# Patient Record
Sex: Male | Born: 1955 | Race: Black or African American | Hispanic: No | Marital: Married | Smoking: Former smoker
Health system: Southern US, Community
[De-identification: ages and names within clinical notes are randomized; demographics above are authoritative.]

## PROBLEM LIST (undated history)

## (undated) ENCOUNTER — Emergency Department (HOSPITAL_COMMUNITY): Payer: Medicaid Other | Source: Home / Self Care

## (undated) DIAGNOSIS — K219 Gastro-esophageal reflux disease without esophagitis: Secondary | ICD-10-CM

## (undated) DIAGNOSIS — N400 Enlarged prostate without lower urinary tract symptoms: Secondary | ICD-10-CM

## (undated) DIAGNOSIS — N529 Male erectile dysfunction, unspecified: Secondary | ICD-10-CM

## (undated) DIAGNOSIS — I10 Essential (primary) hypertension: Secondary | ICD-10-CM

## (undated) HISTORY — DX: Benign prostatic hyperplasia without lower urinary tract symptoms: N40.0

## (undated) HISTORY — DX: Gastro-esophageal reflux disease without esophagitis: K21.9

## (undated) HISTORY — DX: Male erectile dysfunction, unspecified: N52.9

---

## 2015-01-11 ENCOUNTER — Emergency Department (INDEPENDENT_AMBULATORY_CARE_PROVIDER_SITE_OTHER)
Admission: EM | Admit: 2015-01-11 | Discharge: 2015-01-11 | Disposition: A | Discharge status: Nursing Home (Includes ICF) | Payer: Medicaid Other | Source: Home / Self Care | Attending: Family Medicine | Admitting: Family Medicine

## 2015-01-11 ENCOUNTER — Encounter (HOSPITAL_COMMUNITY): Payer: Self-pay | Admitting: Emergency Medicine

## 2015-01-11 ENCOUNTER — Emergency Department (HOSPITAL_COMMUNITY)
Admission: EM | Admit: 2015-01-11 | Discharge: 2015-01-11 | Disposition: A | Discharge status: Home / Self Care | Payer: Medicaid Other | Attending: Emergency Medicine | Admitting: Emergency Medicine

## 2015-01-11 DIAGNOSIS — R1084 Generalized abdominal pain: Secondary | ICD-10-CM | POA: Diagnosis not present

## 2015-01-11 DIAGNOSIS — R072 Precordial pain: Secondary | ICD-10-CM

## 2015-01-11 DIAGNOSIS — I10 Essential (primary) hypertension: Secondary | ICD-10-CM | POA: Diagnosis not present

## 2015-01-11 DIAGNOSIS — R0609 Other forms of dyspnea: Secondary | ICD-10-CM

## 2015-01-11 DIAGNOSIS — R0600 Dyspnea, unspecified: Secondary | ICD-10-CM

## 2015-01-11 DIAGNOSIS — I160 Hypertensive urgency: Secondary | ICD-10-CM | POA: Insufficient documentation

## 2015-01-11 DIAGNOSIS — R079 Chest pain, unspecified: Secondary | ICD-10-CM | POA: Diagnosis present

## 2015-01-11 LAB — I-STAT CHEM 8, ED
BUN: 24 mg/dL — ABNORMAL HIGH (ref 6–20)
CALCIUM ION: 1.25 mmol/L — AB (ref 1.12–1.23)
CREATININE: 1.5 mg/dL — AB (ref 0.61–1.24)
Chloride: 103 mmol/L (ref 101–111)
GLUCOSE: 104 mg/dL — AB (ref 65–99)
HCT: 45 % (ref 39.0–52.0)
HEMOGLOBIN: 15.3 g/dL (ref 13.0–17.0)
POTASSIUM: 4.7 mmol/L (ref 3.5–5.1)
Sodium: 140 mmol/L (ref 135–145)
TCO2: 27 mmol/L (ref 0–100)

## 2015-01-11 LAB — I-STAT TROPONIN, ED: TROPONIN I, POC: 0.03 ng/mL (ref 0.00–0.08)

## 2015-01-11 MED ORDER — LOSARTAN POTASSIUM 25 MG PO TABS: 25.0000 mg | ORAL_TABLET | Freq: Every day | ORAL | Status: DC

## 2015-01-11 MED ORDER — LISINOPRIL 20 MG PO TABS: 20.0000 mg | ORAL_TABLET | Freq: Every day | ORAL | Status: DC

## 2015-01-11 MED ORDER — NITROGLYCERIN 0.4 MG SL SUBL
0.4000 mg | SUBLINGUAL_TABLET | SUBLINGUAL | Status: DC | PRN
  Administered 2015-01-11: 0.4 mg via SUBLINGUAL

## 2015-01-11 MED ORDER — NITROGLYCERIN 0.4 MG SL SUBL
SUBLINGUAL_TABLET | SUBLINGUAL | Status: AC
  Filled 2015-01-11: qty 1

## 2015-01-11 NOTE — ED Notes (Signed)
Pt requested the Dr to change his prescription to lisinopril to something else because "my pressure is still high on lisinopril:" Dr Vanita Panda notified and agreed to change medicine.

## 2015-01-11 NOTE — Discharge Instructions (Signed)
Please be sure to follow-up in our affiliated primary care center. Walk in appointments are available starting at 9 AM weekdays.  Please be sure to discuss your medications with your new physician.  Return here for any concerning changes in your condition.

## 2015-01-11 NOTE — ED Notes (Signed)
C/i neck, headache pain.  [place] the country [place].  No chest pain today, no sob, no chest pain

## 2015-01-11 NOTE — ED Notes (Signed)
Patient is being evaluated by [facility]

## 2015-01-11 NOTE — ED Notes (Signed)
Pt states that he has had a cold for 20 days with an nonproductive cough. Pt states that he has had pain in his chest with his cough. Pt has also had a headache along with some pain in his sides bilaterally.

## 2015-01-11 NOTE — ED Provider Notes (Addendum)
CSN: [id number]     Arrival date & time 01/11/15  2102 History   First MD Initiated Contact with Patient 01/11/15 2108     Chief Complaint  Patient presents with  . Chest Pain  . Headache     (Consider location/radiation/quality/duration/timing/severity/associated sxs/prior Treatment) HPI Patient presents as a transfer from urgent care. Patient states that he always has chest pain, for 3 years. He also acknowledges a history of hypertension. Notably, the patient immigrated from [place] here 1 month ago. He states that he ran out of his medicines, has no medication. His chest pain is unchanged, with no new syncope, nausea, lightheadedness. No new abdominal pain, vomiting. No other new complaints. Today he went to urgent care due to the ongoing chest pain, and the lack of medication. He was referred here for further evaluation and management. The chest pain is not pleuritic, or exertional this is slightly different from the initial note. There is diffuse, anterior, sore.  History reviewed. No pertinent past medical history. History reviewed. No pertinent past surgical history. No family history on file. Social History  Substance Use Topics  . Smoking status: Unknown If Ever Smoked  . Smokeless tobacco: None  . Alcohol Use: None    Review of Systems  Constitutional:       Per HPI, otherwise negative  HENT:       Per HPI, otherwise negative  Respiratory:       Per HPI, otherwise negative  Cardiovascular:       Per HPI, otherwise negative  Gastrointestinal: Negative for vomiting.  Endocrine:       Negative aside from HPI  Genitourinary:       Neg aside from HPI   Musculoskeletal:       Per HPI, otherwise negative  Skin: Negative.   Neurological: Negative for syncope.      Allergies  Review of patient's allergies indicates no known allergies.  Home Medications   Prior to Admission medications   Not on File   BP 151/103 mmHg  Pulse 88  Temp(Src) 98.4 F  (36.9 C) (Oral)  Resp 14  SpO2 97% Physical Exam  Constitutional: He is oriented to person, place, and time. He appears well-developed. No distress.  HENT:  Head: Normocephalic and atraumatic.  Eyes: Conjunctivae and EOM are normal.  Cardiovascular: Normal rate and regular rhythm.   Pulmonary/Chest: Effort normal. No stridor. No respiratory distress.  Abdominal: He exhibits no distension.  Musculoskeletal: He exhibits no edema.  Neurological: He is alert and oriented to person, place, and time.  Skin: Skin is warm and dry.  Psychiatric: He has a normal mood and affect.  Nursing note and vitals reviewed.   ED Course  Procedures (including critical care time) Labs Review Labs Reviewed  I-STAT CHEM 8, ED - Abnormal; Notable for the following:    BUN 24 (*)    Creatinine, Ser 1.50 (*)    Glucose, Bld 104 (*)    Calcium, Ion 1.25 (*)    All other components within normal limits  I-STAT TROPOININ, ED       EKG Interpretation   Date/Time:  Thursday January 11 2015 21:06:46 EST Ventricular Rate:  84 PR Interval:  138 QRS Duration: 79 QT Interval:  367 QTC Calculation: 434 R Axis:   43 Text Interpretation:  Sinus rhythm Probable LVH with secondary repol abnrm  Anterior ST elevation, probably due to LVH Sinus rhythm Left ventricular  hypertrophy ST-t wave abnormality Abnormal ekg Confirmed by Carmin Muskrat  MD [phone]) on 01/11/2015 9:26:15 PM     I reviewed the urgent care documentation, including concern for worsening chest pain.  10:58 PM On repeat exam the patient has no ongoing complaints per We discussed all findings, the need to reinitiate medication, and the patient offers names consistent with ACE inhibitor, which will be restarted.  Patient states that he understands the importance of following up with primary care, at our clinic next week for repeat evaluation, and assurance of ongoing care.   Patient requested a change from lisinopril to alternative  antihypertensives, this was accommodated, lisinopril prescription destroyed. MDM  Patient presents with concern of hypertension, ongoing chest pain, has been present for years. Notably, the patient has no medication. Here no evidence for end organ effects, though there is mild abnormality of creatinine. In no distress, no ongoing pain, the patient was restarted on his ACE inhibitor, provided resources to follow-up with our primary care facility within the next 5 days, discharged in stable condition.  Carmin Muskrat, MD 01/11/15 [id number]  Carmin Muskrat, MD 01/11/15 [phone]

## 2015-01-11 NOTE — ED Notes (Signed)
Patient from [facility] with "heart pain" and headache.  Patient is hypertensive.  Patient has been in the [place] for about one month, he is from [place].  Patient had an abnormal EKG at [facility] per RN report.  Patient speaks limited [place] and speaks Arabic.  Patient is CAOx4 upon arrival.

## 2015-01-11 NOTE — ED Provider Notes (Signed)
CSN: [id number]     Arrival date & time 01/11/15  1808 History   First MD Initiated Contact with Patient 01/11/15 1945     Chief Complaint  Patient presents with  . Hypertension   (Consider location/radiation/quality/duration/timing/severity/associated sxs/prior Treatment) HPI Comments: 59 year old immigrant from [place], arriving 1 month ago and who speaks Arabic presents with complaint of need for medical management of hypertension. He states he also has tightness in the chest. He states that his arteries are tight in the chest as advised by his doctor in [place]. He is having more and more chest pain particularly with exertion. He is also having intermittent episodes of dyspnea and fatigue particularly with exertion. Sometimes he has a headache. The patient points to the mid and left chest as the location of the tightness. Through the interpreter it is my understanding he is currently having  chest tightness. Apparently he has recently received his Medicaid and presents to the urgent care for management of his acute and chronic health care problems. The tightness in the chest has been occurring for approximately 3 years however since arrival to the [place] and being without his medications is becoming more frequent and lasting longer.  Patient is a 59 y.o. male presenting with hypertension.  Hypertension Associated symptoms include chest pain, abdominal pain, headaches and shortness of breath.    History reviewed. No pertinent past medical history. History reviewed. No pertinent past surgical history. No family history on file. Social History  Substance Use Topics  . Smoking status: None  . Smokeless tobacco: None  . Alcohol Use: None    Review of Systems  Constitutional: Positive for activity change and fatigue. Negative for fever.  Respiratory: Positive for chest tightness and shortness of breath.   Cardiovascular: Positive for chest pain.  Gastrointestinal: Positive for  abdominal pain. Negative for vomiting.  Skin: Negative.   Neurological: Positive for headaches.    Allergies  Review of patient's allergies indicates no known allergies.  Home Medications   Prior to Admission medications   Not on File   Meds Ordered and Administered this Visit  Medications - No data to display  BP 152/96 mmHg  Pulse 78  Temp(Src) 98 F (36.7 C) (Oral)  Resp 16  SpO2 98% No data found.   Physical Exam  Constitutional: He appears well-developed and well-nourished. No distress.  Neck: Normal range of motion. Neck supple.  Cardiovascular: Normal rate, regular rhythm and normal heart sounds.   Pulmonary/Chest: Effort normal and breath sounds normal. No respiratory distress. He has no wheezes. He has no rales.  Neurological: He is alert. He exhibits normal muscle tone.  Skin: Skin is warm and dry.  Nursing note and vitals reviewed.   ED Course  Procedures (including critical care time)  Labs Review Labs Reviewed - No data to display  Imaging Review No results found. ED ECG REPORT   Date: 01/11/2015  Rate: 71  Rhythm: normal sinus rhythm  QRS Axis: normal  Intervals: normal  ST/T Wave abnormalities: ST segment approximately one half to 1 mm depression in lead 1, 2. T wave inversions in aVL, V5 and - 6. ST elevation in V1, V2, V3(likely early repolarization), increased amplitude of R waves in the precordial/ lateral leads suggesting left ventricular hypertrophy.  Conduction Disutrbances:none  Narrative Interpretation:   Old EKG Reviewed: none available  I have personally reviewed the EKG tracing and agree with the computerized printout as noted.   Visual Acuity Review  Right Eye Distance:  Left Eye Distance:   Bilateral Distance:    Right Eye Near:   Left Eye Near:    Bilateral Near:         MDM   1. Precordial pain   2. DOE (dyspnea on exertion)   3. Essential hypertension   4. Generalized abdominal pain    Refugee from [place],  been in country for one month and presented to the urgent care for management of acute and chronic disease states including chest pain/tightness that is been occurring more frequently in the past month, HTN, DOE, fatigue. He has been off his medications. My understanding on arrival he was having chest tightness. At the time of transfer is pain free or "not very severe now". He does not offer paper work or bottles for med identification. IV normal saline TKO Oxygen 2L/[place]. Aspirin 5 gr NTG 0.4 mg sl adm 1832h.  BP 190/118. [facility] here 1835h.    Janne Napoleon, NP 01/11/15 2041

## 2015-01-11 NOTE — ED Notes (Signed)
At the time of providing ntg, patient did not have pain, but hypertensive-David Mabe, NP aware and ordered ntg to be given

## 2015-01-11 NOTE — ED Notes (Signed)
[facility]-no truck

## 2015-01-11 NOTE — ED Notes (Signed)
Pt verbalized understanding of d/c instructions and has no further questions. Pt is to follow up with [facility]. Pt in NAD upon d/c.

## 2015-01-11 NOTE — ED Notes (Signed)
Notified [facility] 

## 2015-01-31 ENCOUNTER — Ambulatory Visit: Payer: Self-pay

## 2015-02-13 ENCOUNTER — Emergency Department (HOSPITAL_COMMUNITY)
Admission: EM | Admit: 2015-02-13 | Discharge: 2015-02-13 | Disposition: A | Discharge status: Home / Self Care | Payer: Medicaid Other | Source: Home / Self Care | Attending: Emergency Medicine | Admitting: Emergency Medicine

## 2015-02-13 ENCOUNTER — Encounter (HOSPITAL_COMMUNITY): Payer: Self-pay | Admitting: Emergency Medicine

## 2015-02-13 DIAGNOSIS — I159 Secondary hypertension, unspecified: Secondary | ICD-10-CM

## 2015-02-13 DIAGNOSIS — I10 Essential (primary) hypertension: Secondary | ICD-10-CM

## 2015-02-13 MED ORDER — AMLODIPINE BESYLATE 5 MG PO TABS: 5.0000 mg | ORAL_TABLET | Freq: Every day | ORAL | Status: DC

## 2015-02-13 NOTE — Congregational Nurse Program (Signed)
Congregational Nurse Program Note  Date of Encounter: 02/13/2015  Past Medical History: No past medical history on file.  Encounter Details:     CNP Questionnaire - 02/13/15 1345    Patient Demographics   Is this a new or existing patient? New   Patient is considered a/an Refugee   Patient Assistance   Patient's financial/insurance status Medicaid   Patient referred to apply for the following financial assistance Not Applicable   Food insecurities addressed Not Applicable   Transportation assistance Yes  Taxi voucher   Assistance securing medications No   Educational health offerings Hypertension   Encounter Details   Primary purpose of visit Acute Illness/Condition Visit;Navigating the Healthcare System   Was an Emergency Department visit averted? No   Does patient have a medical provider? No   Patient referred to Urgent Care   Was a mental health screening completed? (GAINS tool) No   Does patient have dental issues? No   Since previous encounter, have you referred patient for abnormal blood pressure that resulted in a new diagnosis or medication change? No   Since previous encounter, have you referred patient for abnormal blood glucose that resulted in a new diagnosis or medication change? No   For Abstraction Use Only   Does patient have insurance? No     Office visit, 10am at [facility] requesting blood pressure check. No obvious distress noted. Blood pressure 186/108. Pulse 65; 165/107. Pulse 76/ ?irregular.  Expressed concern that medication, Losartan 25 mg, ordered by [facility] unavailable at [facility]. Prescription confirmed for 30 days only. Follow-up appointment with [facility] on 11-16 apparently canceled by client per staff. Client denied knowledge of cancellation. Advised to call back 03-06-15 for scheduling. Placed on call  list for cancellations. Describes multiple areas of body with pain including history of chest and arm pain. No  other medical documentation available.  Plan: Refer to [facility] for evaluation and medication refill todayl. Place on [facility] waiting list for appointment if cancellations. Return for B/P follow-up at [place]. Information provided regarding use of pharmacy. Marland Kitchen

## 2015-02-13 NOTE — ED Notes (Signed)
Offered pt interpreter but he declined; states he speaks [place] Able to understand pt somewhat but provider may want to encourage phone interpreter Pt referred by [facility] Congregational Nurse for HTN BP was this am taken by Congregational Nurse was 186/108 Pt today c/o mild HA (3/10) and feeling weak Denies CP, blurry vision Seen here on 11/10 for similar sx Taking Losartan 25 mg A&O x4... No acute distress.

## 2015-02-13 NOTE — Discharge Instructions (Signed)
There is a risk of taking medications prescribed by a health care provider without the time or ability for obtaining a complete history, labwork, proper physical exam, etc. This cannot be completed adequately at an urgent care. There can be multiple problems, some serious,  associated with medications and undetermined conditions of your health status. This action is performed as a last resort effort in order to supply you with medication. By receiving these prescriptions you are ackowleging and accepting these risks and will not hold the prescriber or any agent of [facility] as responsible for any adverse outcomes.     Hypertension Hypertension, commonly called high blood pressure, is when the force of blood pumping through your arteries is too strong. Your arteries are the blood vessels that carry blood from your heart throughout your body. A blood pressure reading consists of a higher number over a lower number, such as 110/72. The higher number (systolic) is the pressure inside your arteries when your heart pumps. The lower number (diastolic) is the pressure inside your arteries when your heart relaxes. Ideally you want your blood pressure below 120/80. Hypertension forces your heart to work harder to pump blood. Your arteries may become narrow or stiff. Having untreated or uncontrolled hypertension can cause heart attack, stroke, kidney disease, and other problems. RISK FACTORS Some risk factors for high blood pressure are controllable. Others are not.  Risk factors you cannot control include:   Race. You may be at higher risk if you are African American.  Age. Risk increases with age.  Gender. Men are at higher risk than women before age 93 years. After age 12, women are at higher risk than men. Risk factors you can control include:  Not getting enough exercise or physical activity.  Being overweight.  Getting too much fat, sugar, calories, or salt in your  diet.  Drinking too much alcohol. SIGNS AND SYMPTOMS Hypertension does not usually cause signs or symptoms. Extremely high blood pressure (hypertensive crisis) may cause headache, anxiety, shortness of breath, and nosebleed. DIAGNOSIS To check if you have hypertension, your health care provider will measure your blood pressure while you are seated, with your arm held at the level of your heart. It should be measured at least twice using the same arm. Certain conditions can cause a difference in blood pressure between your right and left arms. A blood pressure reading that is higher than normal on one occasion does not mean that you need treatment. If it is not clear whether you have high blood pressure, you may be asked to return on a different day to have your blood pressure checked again. Or, you may be asked to monitor your blood pressure at home for 1 or more weeks. TREATMENT Treating high blood pressure includes making lifestyle changes and possibly taking medicine. Living a healthy lifestyle can help lower high blood pressure. You may need to change some of your habits. Lifestyle changes may include:  Following the DASH diet. This diet is high in fruits, vegetables, and whole grains. It is low in salt, red meat, and added sugars.  Keep your sodium intake below 2,300 mg per day.  Getting at least 30-45 minutes of aerobic exercise at least 4 times per week.  Losing weight if necessary.  Not smoking.  Limiting alcoholic beverages.  Learning ways to reduce stress. Your health care provider may prescribe medicine if lifestyle changes are not enough to get your blood pressure under control, and if  one of the following is true:  You are 55-65 years of age and your systolic blood pressure is above 140.  You are 39 years of age or older, and your systolic blood pressure is above 150.  Your diastolic blood pressure is above 90.  You have diabetes, and your systolic blood pressure is over  [id number] or your diastolic blood pressure is over 90.  You have kidney disease and your blood pressure is above 140/90.  You have heart disease and your blood pressure is above 140/90. Your personal target blood pressure may vary depending on your medical conditions, your age, and other factors. HOME CARE INSTRUCTIONS  Have your blood pressure rechecked as directed by your health care provider.   Take medicines only as directed by your health care provider. Follow the directions carefully. Blood pressure medicines must be taken as prescribed. The medicine does not work as well when you skip doses. Skipping doses also puts you at risk for problems.  Do not smoke.   Monitor your blood pressure at home as directed by your health care provider. SEEK MEDICAL CARE IF:   You think you are having a reaction to medicines taken.  You have recurrent headaches or feel dizzy.  You have swelling in your ankles.  You have trouble with your vision. SEEK IMMEDIATE MEDICAL CARE IF:  You develop a severe headache or confusion.  You have unusual weakness, numbness, or feel faint.  You have severe chest or abdominal pain.  You vomit repeatedly.  You have trouble breathing. MAKE SURE YOU:   Understand these instructions.  Will watch your condition.  Will get help right away if you are not doing well or get worse.   This information is not intended to replace advice given to you by your health care provider. Make sure you discuss any questions you have with your health care provider.   Document Released: 02/17/2005 Document Revised: 07/04/2014 Document Reviewed: 12/10/2012 Elsevier Interactive Patient Education [employer].

## 2015-02-13 NOTE — ED Provider Notes (Signed)
CSN: [id number]     Arrival date & time 02/13/15  1322 History   First MD Initiated Contact with Patient 02/13/15 1426     Chief Complaint  Patient presents with  . Hypertension   (Consider location/radiation/quality/duration/timing/severity/associated sxs/prior Treatment) HPI Comments: 59 year old male is an African immigrant that in or the country approximately 2 months ago. One month ago he was seen in this urgent care for management of hypertension. He was found to have chest pain off and on for a "long time". He had also been having chronic headaches. He had been out of his medications and we do not know what that medication was. Due to his new arrival, unknown past medical history and type of management/medication he was sent to the emergency department. He was found not to have an acute cardiology problem. He was discharged on losartan 25 mg. The congregation nurse repeated his blood pressure today and it was 187/110 and again sent to the urgent care. The patient states that the losartan makes him feel funny in the head and requests not to have that medication refilled. He has Medicaid and apparently he is on a waiting list for the family practice. He has similar chest pain that he has been having for over 3 years as well as anterior chest wall tenderness that reproduces the same pain. He is in no acute distress.   History reviewed. No pertinent past medical history. History reviewed. No pertinent past surgical history. No family history on file. Social History  Substance Use Topics  . Smoking status: Never Smoker   . Smokeless tobacco: None  . Alcohol Use: No    Review of Systems  Constitutional: Negative for fever, activity change and fatigue.  Respiratory: Negative for cough and shortness of breath.   Cardiovascular: Positive for chest pain. Negative for leg swelling.  Gastrointestinal: Negative.   Skin: Negative.   Neurological: Positive for headaches. Negative for facial  asymmetry and speech difficulty.    Allergies  Review of patient's allergies indicates no known allergies.  Home Medications   Prior to Admission medications   Medication Sig Start Date End Date Taking? Authorizing Provider  amLODipine (NORVASC) 5 MG tablet Take 1 tablet (5 mg total) by mouth daily. 02/13/15   Janne Napoleon, NP   Meds Ordered and Administered this Visit  Medications - No data to display  BP 177/96 mmHg  Pulse 69  Temp(Src) 98.2 F (36.8 C) (Oral)  Resp 20  SpO2 99% No data found.   Physical Exam  Constitutional: He appears well-developed and well-nourished. No distress.  Eyes: EOM are normal.  Neck: Normal range of motion. Neck supple.  Cardiovascular: Normal rate, regular rhythm and normal heart sounds.   Pulmonary/Chest: Effort normal and breath sounds normal. No respiratory distress. He has no wheezes. He exhibits tenderness.  Musculoskeletal: He exhibits no edema.  Lymphadenopathy:    He has no cervical adenopathy.  Neurological: He is alert. No cranial nerve deficit. He exhibits normal muscle tone.  Skin: Skin is warm and dry.  Psychiatric: He has a normal mood and affect. His behavior is normal.  Nursing note and vitals reviewed.   ED Course  Procedures (including critical care time)  Labs Review Labs Reviewed - No data to display  Imaging Review No results found.   Visual Acuity Review  Right Eye Distance:   Left Eye Distance:   Bilateral Distance:    Right Eye Near:   Left Eye Near:    Bilateral Near:  MDM   1. Essential hypertension    Stop losartan and start norvasc 5 mg po q d.  Must f/u with PCP ASAP    Janne Napoleon, NP 02/13/15 1457

## 2015-02-13 NOTE — Congregational Nurse Program (Signed)
Congregational Nurse Program Note  Date of Encounter: 02/13/2015  Past Medical History: No past medical history on file.  Encounter Details:     CNP Questionnaire - 02/13/15 1345    Patient Demographics   Is this a new or existing patient? New   Patient is considered a/an Refugee   Patient Assistance   Patient's financial/insurance status Medicaid   Patient referred to apply for the following financial assistance Not Applicable   Food insecurities addressed Not Applicable   Transportation assistance Yes  Taxi voucher   Assistance securing medications No   Educational health offerings Hypertension   Encounter Details   Primary purpose of visit Acute Illness/Condition Visit;Navigating the Healthcare System   Was an Emergency Department visit averted? No   Does patient have a medical provider? No   Patient referred to Urgent Care   Was a mental health screening completed? (GAINS tool) No   Does patient have dental issues? No   Since previous encounter, have you referred patient for abnormal blood pressure that resulted in a new diagnosis or medication change? No   Since previous encounter, have you referred patient for abnormal blood glucose that resulted in a new diagnosis or medication change? No   For Abstraction Use Only   Does patient have insurance? No

## 2015-03-06 DIAGNOSIS — I159 Secondary hypertension, unspecified: Secondary | ICD-10-CM

## 2015-03-06 NOTE — Congregational Nurse Program (Signed)
Congregational Nurse Program Note  Date of Encounter: 03/06/2015  Past Medical History: No past medical history on file.  Encounter Details:     CNP Questionnaire - 03/06/15 1307    Patient Demographics   Is this a new or existing patient? Existing   Patient is considered a/an Refugee   Race African   Patient Assistance   Location of Patient Assistance Not Applicable   Patient's financial/insurance status Medicaid   Uninsured Patient No   Patient referred to apply for the following financial assistance Not Applicable   Food insecurities addressed Not Applicable   Transportation assistance No   Assistance securing medications No   Educational health offerings Hypertension   Encounter Details   Primary purpose of visit Acute Illness/Condition Visit;Navigating the Healthcare System   Was an Emergency Department visit averted? Not Applicable   Does patient have a medical provider? Yes   Patient referred to Follow up with established PCP   Was a mental health screening completed? (GAINS tool) No   Does patient have dental issues? No   Since previous encounter, have you referred patient for abnormal blood pressure that resulted in a new diagnosis or medication change? Yes   Since previous encounter, have you referred patient for abnormal blood glucose that resulted in a new diagnosis or medication change? No   For Abstraction Use Only   Does patient have insurance? No    Office visit to follow-up elevated blood pressure and referral to Urgent Care on 02-13-15. Treated for hypertension and released home with prescription medications. Blood pressure 164/96; pulse 73; respiration 22. Able to relate information in limited [place]; primary language is Arabic. Admits to forgetting to take dose of Lisinopril this am.  Describes symptoms of dizziness sitting or lying. Alert and in no visible distress. Plan:Take medications as instructed immediately today.  Return to office 03/06/15 for B/P  check and review of medications. Info on hypertension provided and nutritional info on limiting salt intake. Limit extreme activity until PCP/[facility] visit. Seek emergency care if symptoms worse or change, i.e chest pain, severe headache, blurry vision, nausea and vomiting.

## 2015-03-07 DIAGNOSIS — I159 Secondary hypertension, unspecified: Secondary | ICD-10-CM

## 2015-03-07 NOTE — Congregational Nurse Program (Signed)
Congregational Nurse Program Note  Date of Encounter: 03/07/2015  Past Medical History: No past medical history on file.  Encounter Details:     CNP Questionnaire - 03/07/15 1517    Patient Demographics   Is this a new or existing patient? Existing   Patient is considered a/an Refugee   Race African   Patient Assistance   Location of Patient Assistance Not Applicable   Patient's financial/insurance status [facility];Medicaid   Uninsured Patient No   Patient referred to apply for the following financial assistance Medicaid   Food insecurities addressed Not Applicable   Assistance securing medications No   Educational health offerings Hypertension   Encounter Details   Primary purpose of visit Acute Illness/Condition Visit   Was an Emergency Department visit averted? Not Applicable   Does patient have a medical provider? Yes   Patient referred to Establish PCP   Was a mental health screening completed? (GAINS tool) No   Does patient have dental issues? No   Since previous encounter, have you referred patient for abnormal blood pressure that resulted in a new diagnosis or medication change? No   Since previous encounter, have you referred patient for abnormal blood glucose that resulted in a new diagnosis or medication change? No   For Abstraction Use Only   Does patient have insurance? No     Office visit to follow-u[p elevated B/P and review medications. Blood pressure 156/97 @ 10am. Repeated 156/91. Pulse 78. Continues to experience intermittent dizziness upon frequent movements and bending forward. Medications taken as directed: Lisinopril 12.5 daily; Metoprolol succinate 25 mg daily; Aspirin 81 mg delayed release daily. Omeprazole 20mg  daily. Amlodipine and Losartan discontinued on 02/28/15. Follow-up appointment scheduled 03-27-15; 9am; [facility];[address]. Return to see  Nurse 01-10 for blood pressure check.

## 2015-03-07 NOTE — Patient Instructions (Signed)
Follow up visit to [facility] 1 week;check blood pressure.

## 2015-03-15 ENCOUNTER — Encounter: Payer: Self-pay | Admitting: Family Medicine

## 2015-03-15 ENCOUNTER — Ambulatory Visit (INDEPENDENT_AMBULATORY_CARE_PROVIDER_SITE_OTHER): Payer: Medicaid Other | Admitting: Family Medicine

## 2015-03-15 VITALS — BP 168/98 | HR 78 | Temp 97.9°F

## 2015-03-15 DIAGNOSIS — I10 Essential (primary) hypertension: Secondary | ICD-10-CM

## 2015-03-15 DIAGNOSIS — N529 Male erectile dysfunction, unspecified: Secondary | ICD-10-CM | POA: Diagnosis not present

## 2015-03-15 DIAGNOSIS — Z7189 Other specified counseling: Secondary | ICD-10-CM | POA: Diagnosis not present

## 2015-03-15 DIAGNOSIS — Z7689 Persons encountering health services in other specified circumstances: Secondary | ICD-10-CM

## 2015-03-15 DIAGNOSIS — Z1211 Encounter for screening for malignant neoplasm of colon: Secondary | ICD-10-CM | POA: Diagnosis not present

## 2015-03-15 LAB — COMPLETE METABOLIC PANEL WITH GFR
ALT: 14 U/L (ref 9–46)
AST: 18 U/L (ref 10–35)
Albumin: 4.3 g/dL (ref 3.6–5.1)
Alkaline Phosphatase: 77 U/L (ref 40–115)
BILIRUBIN TOTAL: 0.4 mg/dL (ref 0.2–1.2)
BUN: 28 mg/dL — AB (ref 7–25)
CHLORIDE: 102 mmol/L (ref 98–110)
CO2: 24 mmol/L (ref 20–31)
CREATININE: 1.48 mg/dL — AB (ref 0.70–1.33)
Calcium: 9.6 mg/dL (ref 8.6–10.3)
GFR, EST AFRICAN AMERICAN: 60 mL/min (ref >=60)
GFR, Est Non African American: 52 mL/min — ABNORMAL LOW (ref >=60)
GLUCOSE: 81 mg/dL (ref 65–99)
Potassium: 4.7 mmol/L (ref 3.5–5.3)
Sodium: 142 mmol/L (ref 135–146)
TOTAL PROTEIN: 7.4 g/dL (ref 6.1–8.1)

## 2015-03-15 LAB — CBC WITH DIFFERENTIAL/PLATELET
Basophils Absolute: 0.1 10*3/uL (ref 0.0–0.1)
Basophils Relative: 1 % (ref 0–1)
Eosinophils Absolute: 0.4 10*3/uL (ref 0.0–0.7)
Eosinophils Relative: 4 % (ref 0–5)
HCT: 43.8 % (ref 39.0–52.0)
Hemoglobin: 14.5 g/dL (ref 13.0–17.0)
Lymphocytes Relative: 19 % (ref 12–46)
Lymphs Abs: 1.8 10*3/uL (ref 0.7–4.0)
MCH: 28.8 pg (ref 26.0–34.0)
MCHC: 33.1 g/dL (ref 30.0–36.0)
MCV: 87.1 fL (ref 78.0–100.0)
MPV: 10.6 fL (ref 8.6–12.4)
Monocytes Absolute: 0.8 10*3/uL (ref 0.1–1.0)
Monocytes Relative: 8 % (ref 3–12)
Neutro Abs: 6.4 10*3/uL (ref 1.7–7.7)
Neutrophils Relative %: 68 % (ref 43–77)
Platelets: 209 10*3/uL (ref 150–400)
RBC: 5.03 MIL/uL (ref 4.22–5.81)
RDW: 14.2 % (ref 11.5–15.5)
WBC: 9.4 10*3/uL (ref 4.0–10.5)

## 2015-03-15 LAB — LIPID PANEL
Cholesterol: 174 mg/dL (ref 125–200)
HDL: 44 mg/dL (ref >=40)
LDL CALC: 101 mg/dL (ref <130)
TRIGLYCERIDES: 145 mg/dL (ref <150)
Total CHOL/HDL Ratio: 4 Ratio (ref <=5.0)
VLDL: 29 mg/dL (ref <30)

## 2015-03-15 MED ORDER — AMLODIPINE BESYLATE 10 MG PO TABS: 10.0000 mg | ORAL_TABLET | Freq: Every day | ORAL | Status: DC

## 2015-03-15 NOTE — Progress Notes (Addendum)
Patient ID: Thomas Bailey, male   DOB: 1955/04/11, 60 y.o.   MRN: [id number]   Thomas Bailey, is a 60 y.o. male  [id number]  [id number]  DOB - 05-Jan-1956  CC:  Chief Complaint  Patient presents with  . new patient, establish care and has problems    Pietro Cassis, interpreter with patient, high blood pressure, has pain in chest, pain in side of abd, feels full after eating so he feels he doesnt eat enough, also thinks he has ED due to B/P issues is not sexually active       HPI: Thomas Bailey is a 60 y.o. male here to establish care. He has a history of hypertension, non-specific chest pain. He was recently seen at Urgent care and losartan and lisinopril discontinued and amlodipine 5 mg and lisinopril hctz started. He also c/o long standing abdominal pressure occasionally. He also c/o ED and request referral to urology.  Infomration is obtained with the help of an interpreter.He reports Tdap and influenza being up to date.  No Known Allergies No past medical history on file. Current Outpatient Prescriptions on File Prior to Visit  Medication Sig Dispense Refill  . [DISCONTINUED] lisinopril (PRINIVIL,ZESTRIL) 20 MG tablet Take 1 tablet (20 mg total) by mouth daily. 30 tablet 2  . [DISCONTINUED] losartan (COZAAR) 25 MG tablet Take 1 tablet (25 mg total) by mouth daily. 30 tablet 0   No current facility-administered medications on file prior to visit.   No family history on file. Social History   Social History  . Marital Status: Married    Spouse Name: N/A  . Number of Children: N/A  . Years of Education: N/A   Occupational History  . Not on file.   Social History Main Topics  . Smoking status: Never Smoker   . Smokeless tobacco: Not on file  . Alcohol Use: No  . Drug Use: No  . Sexual Activity: Not on file   Other Topics Concern  . Not on file   Social History Narrative    Review of Systems: Constitutional: Negative for fever, chills, appetite  change, weight loss,  Fatigue. Skin: Negative for rashes or lesions of concern. HENT: Negative for ear pain, ear discharge.nose bleeds Eyes: Negative for pain, discharge, redness, itching and visual disturbance. Neck: Negative for pain, stiffness Respiratory: Negative for cough. Positive for perceived  shortness of breath,   Cardiovascular: Positive for non-specific  chest pain which has been evaluated at Urgent Care and in ED with no cardiac issues identified. He also reports palpitations and leg swelling. Gastrointestinal: Positive  for abdominal pain, Heartburn. nausea, vomiting, diarrhea, constipations Genitourinary: Negative for dysuria, urgency, frequency, hematuria,  Musculoskeletal: Positive  for back pain, joint pain in hips and ankles joint  swelling, and gait problem.Negative for weakness. Neurological: Negative for tremors, seizures, syncope,   light-headedness, numbness and headaches. Positive for positional dizziness. Hematological: Negative for easy bruising or bleeding Psychiatric/Behavioral: Negative for depression, anxiety, decreased concentration, confusion   Objective:   Filed Vitals:   03/15/15 0948 03/15/15 0949  BP: 178/112 168/98  Pulse: 78   Temp: 97.9 F (36.6 C)     Physical Exam: Constitutional: Patient appears well-developed and well-nourished. No distress. HENT: Normocephalic, atraumatic, External right and left ear normal. Oropharynx is clear and moist.  Eyes: Conjunctivae and EOM are normal. PERRLA, no scleral icterus. Neck: Normal ROM. Neck supple. No lymphadenopathy, No thyromegaly. CVS: RRR, S1/S2 +, no murmurs, no gallops, no rubs Pulmonary: Effort and breath sounds  normal, no stridor, rhonchi, wheezes, rales.  Abdominal: Soft. Normoactive BS,, no distension, tenderness, rebound or guarding.  Musculoskeletal: Normal range of motion. No edema and no tenderness.  Neuro: Alert.Normal muscle tone coordination. Non-focal Skin: Skin is warm and dry. No  rash noted. Not diaphoretic. No erythema. No pallor. Psychiatric: Normal mood and affect. Behavior, judgment, thought content normal.  Lab Results  Component Value Date   HGB 15.3 01/11/2015   HCT 45.0 01/11/2015   Lab Results  Component Value Date   CREATININE 1.50* 01/11/2015   BUN 24* 01/11/2015   NA 140 01/11/2015   K 4.7 01/11/2015   CL 103 01/11/2015    No results found for: HGBA1C Lipid Panel  No results found for: CHOL, TRIG, HDL, CHOLHDL, VLDL, LDLCALC     Assessment and plan:   1. Essential hypertension  - COMPLETE METABOLIC PANEL WITH GFR - CBC w/Diff - TSH - Lipid panel - amLODipine (NORVASC) 10 MG tablet; Take 1 tablet (10 mg total) by mouth daily.  Dispense: 90 tablet; Refill: 3 -Return in one week for BP check with nurse.  2. Erectile dysfunction, unspecified erectile dysfunction type  - Ambulatory referral to Urology  3. Encounter to establish care -I have reviewed information provided by the patient and available information in his chart.    Return in about 1 week (around 03/22/2015) for HTN.  The patient was given clear instructions to go to ER or return to medical center if symptoms don't improve, worsen or new problems develop. The patient verbalized understanding.    Micheline Chapman FNP  03/15/2015, 1:03 PM

## 2015-03-15 NOTE — Patient Instructions (Signed)
I am increasing amlodipine from 5 mg to 10 mg daily. Continue other medications at current dosage. Come back in one week for nurse visit for BP check. Watch salt in diet Sit on side of bed when you get up until dizziness passes. Will put in referral to urologist for ED.

## 2015-03-16 LAB — TSH: TSH: 0.946 u[IU]/mL (ref 0.350–4.500)

## 2015-03-20 DIAGNOSIS — I159 Secondary hypertension, unspecified: Secondary | ICD-10-CM

## 2015-03-20 NOTE — Congregational Nurse Program (Signed)
Congregational Nurse Program Note  Date of Encounter: 03/20/2015  Past Medical History: No past medical history on file.  Encounter Details:     CNP Questionnaire - 03/20/15 1348    Patient Demographics   Is this a new or existing patient? Existing   Patient is considered a/an Refugee   Race African   Patient Assistance   Location of Patient Assistance Not Applicable   Patient's financial/insurance status Medicaid   Uninsured Patient No   Patient referred to apply for the following financial assistance Not Applicable   Food insecurities addressed Not Applicable   Transportation assistance No   Assistance securing medications No   Educational health offerings Not Applicable   Encounter Details   Primary purpose of visit Post PCP Visit   Was an Emergency Department visit averted? Not Applicable   Does patient have a medical provider? Yes   Patient referred to Follow up with established PCP   Was a mental health screening completed? (GAINS tool) No   Does patient have dental issues? No   Since previous encounter, have you referred patient for abnormal blood pressure that resulted in a new diagnosis or medication change? No   Since previous encounter, have you referred patient for abnormal blood glucose that resulted in a new diagnosis or medication change? No   For Abstraction Use Only   Does patient have insurance? No         Amb Nursing Assessment - 03/15/15 0955    Language Assistant   Interpreter Name Pietro Cassis     Office visit to recheck B/P. Continued hypertensive, despite changes in medication. Client confirmed medications taken as instructed. Recent new patient visit establish PCP and return visit to provider at [facility] 03-22-15. Complained of bilateral low back pain today. Denies changes in urinary pattern or painful, bloody urination. Return for blood pressure recheck on 03-31-15 per request of patient.   Plan: Review of appointment follow-up with PCP; bus  transportation information confirmed. Recheck blood pressure 03-21-15. Stress  importance of meds taken as ordered and low salt diet.

## 2015-03-21 DIAGNOSIS — I159 Secondary hypertension, unspecified: Secondary | ICD-10-CM

## 2015-03-21 NOTE — Congregational Nurse Program (Unsigned)
Congregational Nurse Program Note  Date of Encounter: 03/21/2015  Past Medical History: No past medical history on file.  Encounter Details:     CNP Questionnaire - 03/21/15 1310    Patient Demographics   Is this a new or existing patient? Existing   Patient is considered a/an Refugee   Race African   Patient Assistance   Patient's financial/insurance status Medicaid   Uninsured Patient No   Patient referred to apply for the following financial assistance Not Applicable   Food insecurities addressed Not Applicable   Transportation assistance Yes  Taxi voucher for 03/22/15 appt.   Educational health offerings Hypertension   Encounter Details   Primary purpose of visit Education/Health Concerns   Was an Emergency Department visit averted? Not Applicable   Does patient have a medical provider? Yes   Patient referred to Follow up with established PCP   Was a mental health screening completed? (GAINS tool) No   Does patient have dental issues? No   Since previous encounter, have you referred patient for abnormal blood pressure that resulted in a new diagnosis or medication change? No   Since previous encounter, have you referred patient for abnormal blood glucose that resulted in a new diagnosis or medication change? No   For Abstraction Use Only   Does patient have insurance? No         Amb Nursing Assessment - 03/15/15 0955    Language Assistant   Interpreter Name Pietro Cassis     Office visit at [facility] to follow-up elevated blood pressure and expressed concerns about cause of problem. Confirmed PCP appointment in [facility] on 03-22-15 at 9 am with Sharon Seller, NP.

## 2015-03-22 ENCOUNTER — Other Ambulatory Visit (INDEPENDENT_AMBULATORY_CARE_PROVIDER_SITE_OTHER): Payer: Medicaid Other | Admitting: Family Medicine

## 2015-03-22 ENCOUNTER — Encounter: Payer: Self-pay | Admitting: Family Medicine

## 2015-03-22 ENCOUNTER — Other Ambulatory Visit (HOSPITAL_COMMUNITY): Payer: Self-pay | Admitting: Family Medicine

## 2015-03-22 DIAGNOSIS — R079 Chest pain, unspecified: Secondary | ICD-10-CM

## 2015-03-22 MED ORDER — METOPROLOL SUCCINATE ER 50 MG PO TB24: 50.0000 mg | ORAL_TABLET | Freq: Every day | ORAL | Status: DC

## 2015-03-22 NOTE — Patient Instructions (Addendum)
Patient doesn't think prilosec is helping, advised Sharon Seller, NP, advised patient per her order to increase Prilosec to 2x day for 1 week, after 1 week take one tablet daily. New rx sent to pharmacy for blood pressure. Recheck here in 2 weeks and to call sooner if symptoms worsen or persist.  1. There is nothing to suggest that your chest pain is related to your heart. 2. Your description and the fact that it hurts to press indicates that it is muscle pain. Since it only last a minute or two and only happens 2-3 times a week, I would not treat with pain medication. 3. I suggest you eat several small meals a day rather than 1-2 large meals. Pay attention to foods that cause stomach discomfort and avoid them. 4. I have increased your metoprolol to 50 mg twice a day. 5. Come back for a BP check with the nurse in 2 weeks.

## 2015-03-22 NOTE — Progress Notes (Signed)
Patient ID: Thomas Bailey, male   DOB: [birth year], 58 y.o.   MRN: [id number]   Thomas Bailey, is a 60 y.o. male  [id number]  [id number]  DOB - 1955/03/15  CC: No chief complaint on file.      HPI: Thomas Bailey is a 60 y.o. male here for follow-up on hypertension. He was seen a week ago and part of his complaint was long standing intermittent chest pain. This was evaluated in ED as he was having pain at that time. His ECG Showed LVH but no ischemic changes.  Today he once again brings up the chest pain. He mentioned that he was diagnosed with a narrow artery in [place] but does not have a copy of his records.  He reports the pain is in the left anterio/lateral chest and sometimes radiates into the left arm. He reports it is sharp, last for a minute or two and happens 2-3 times a week.  He is unclear about any precipitating factors, except he has notices it tends to happen when he is in bed on his left side and resolves when he rolls over.  He denies anyother symptoms such as shortness of breath, dizziness, perspiring. He reports the pain is 5/10 at this time.He also complains of abdominal pain if he is hungry or if he eats too much.  No Known Allergies No past medical history on file. Current Outpatient Prescriptions on File Prior to Visit  Medication Sig Dispense Refill  . amLODipine (NORVASC) 10 MG tablet Take 1 tablet (10 mg total) by mouth daily. 90 tablet 3  . ASPIRIN LOW DOSE 81 MG EC tablet TK 1 T PO QD  11  . lisinopril-hydrochlorothiazide (PRINZIDE,ZESTORETIC) 20-12.5 MG tablet TK 1 T PO QD  5  . omeprazole (PRILOSEC) 20 MG capsule TK 1 C PO QD BEFORE A MEAL  5  . [DISCONTINUED] lisinopril (PRINIVIL,ZESTRIL) 20 MG tablet Take 1 tablet (20 mg total) by mouth daily. 30 tablet 2  . [DISCONTINUED] losartan (COZAAR) 25 MG tablet Take 1 tablet (25 mg total) by mouth daily. 30 tablet 0   No current facility-administered medications on file prior to visit.   No family  history on file. Social History   Social History  . Marital Status: Married    Spouse Name: N/A  . Number of Children: N/A  . Years of Education: N/A   Occupational History  . Not on file.   Social History Main Topics  . Smoking status: Never Smoker   . Smokeless tobacco: Not on file  . Alcohol Use: No  . Drug Use: No  . Sexual Activity: Not on file   Other Topics Concern  . Not on file   Social History Narrative    Review of Systems: Constitutional: Negative for fever, chills, appetite change, weight loss,  Fatigue. Skin: Negative for rashes or lesions of concern. HENT: Negative for ear pain, ear discharge.nose bleeds Eyes: Negative for pain, discharge, redness, itching and visual disturbance. Neck: Negative for pain, stiffness Respiratory: Negative for cough, shortness of breath,   Cardiovascular: Positive for chest pain,. Negative for palpitations and leg swelling. Gastrointestinal: Negative for abdominal pain, nausea, vomiting, diarrhea, constipations Genitourinary: Negative for dysuria, urgency, frequency, hematuria,  Musculoskeletal: Negative for back pain, joint pain, joint  swelling, and gait problem.Negative for weakness. Neurological: Negative for dizziness, tremors, seizures, syncope,   light-headedness, numbness and headaches.  Hematological: Negative for easy bruising or bleeding Psychiatric/Behavioral: Negative for depression, anxiety, decreased concentration, confusion   Objective:  Filed Vitals:   03/22/15 0955  BP: 172/96    Physical Exam: Constitutional: Patient appears well-developed and well-nourished. No distress. HENT: Normocephalic, atraumatic, External right and left ear normal. Oropharynx is clear and moist.  Eyes: Conjunctivae and EOM are normal. PERRLA, no scleral icterus. Neck: Normal ROM. Neck supple. No lymphadenopathy, No thyromegaly. CVS: RRR, S1/S2 +, no murmurs, no gallops, no rubs Pulmonary: Effort and breath sounds normal, no  stridor, rhonchi, wheezes, rales.  Abdominal: Soft. Normoactive BS,, no distension, tenderness, rebound or guarding.  Musculoskeletal: Normal range of motion. No edema. There is tenderness over the left anteriolateral chest.An EKG was consistent with EKGs done in ED and Urgent Care recently. There are no acute ischemic changes. There is evidence for LVH. Neuro: Alert.Normal muscle tone coordination. Non-focal Skin: Skin is warm and dry. No rash noted. Not diaphoretic. No erythema. No pallor. Psychiatric: Normal mood and affect. Behavior, judgment, thought content normal.  Lab Results  Component Value Date   WBC 9.4 03/15/2015   HGB 14.5 03/15/2015   HCT 43.8 03/15/2015   MCV 87.1 03/15/2015   PLT 209 03/15/2015   Lab Results  Component Value Date   CREATININE 1.48* 03/15/2015   BUN 28* 03/15/2015   NA 142 03/15/2015   K 4.7 03/15/2015   CL 102 03/15/2015   CO2 24 03/15/2015    No results found for: HGBA1C Lipid Panel     Component Value Date/Time   CHOL 174 03/15/2015 1036   TRIG 145 03/15/2015 1036   HDL 44 03/15/2015 1036   CHOLHDL 4.0 03/15/2015 1036   VLDL 29 03/15/2015 1036   LDLCALC 101 03/15/2015 1036       Assessment and plan:   1. Chest Pain  - I have spend about 20 minutes explaining to him why I do not think the chest pain he is experiencing is cardiac, but rather musculskeletal. I have also explained that this chest pain has been assessed twice report in ED and Urgent Care with the same conclusion. As far as his abdominal discomfort when he is empty or too full.  I have asked him to eat several small meals  No Follow-up on file.  The patient was given clear instructions to go to ER or return to medical center if symptoms don't improve, worsen or new problems develop. The patient verbalized understanding.    Micheline Chapman FNP  03/22/2015, 10:43 AM

## 2015-04-05 ENCOUNTER — Ambulatory Visit (INDEPENDENT_AMBULATORY_CARE_PROVIDER_SITE_OTHER): Payer: Medicaid Other | Admitting: Family Medicine

## 2015-04-05 ENCOUNTER — Encounter: Payer: Self-pay | Admitting: Family Medicine

## 2015-04-05 VITALS — BP 124/83 | HR 97 | Temp 98.1°F | Resp 18 | Ht 66.0 in | Wt 148.0 lb

## 2015-04-05 DIAGNOSIS — R1084 Generalized abdominal pain: Secondary | ICD-10-CM

## 2015-04-05 NOTE — Progress Notes (Signed)
Patient ID: Thomas Bailey, male   DOB: [birth year], 60 y.o.   MRN: [id number]   Thomas Bailey, is a 60 y.o. male  [id number]  [id number]  DOB - 1955-12-11  CC:  Chief Complaint  Patient presents with  . Follow-up       HPI: Thomas Bailey is a 60 y.o. male here for follow-up of abdominal pain. Information received from the patient was received with the help of an interpreter. He reports he has had the pain for months. The pain is in the epigastric area with radiation bilaterally in a lateral direction to the lateral mid lower abdomen. Eating is the aggravating factor but not any particular foods. The pain starts shortly after a meal and last for 3-4 hours and resolves before the next meal and then repeats. He reports the pain as being sharp and 5/10. He has found no relieving factors. I prescribed prilosec when he was seen last but he reports that has not helped. He reports being treated for an intestinal condition in the past but does not know what. Questioning implies that it might have been h pylori.   No Known Allergies No past medical history on file. Current Outpatient Prescriptions on File Prior to Visit  Medication Sig Dispense Refill  . amLODipine (NORVASC) 10 MG tablet Take 1 tablet (10 mg total) by mouth daily. 90 tablet 3  . ASPIRIN LOW DOSE 81 MG EC tablet TK 1 T PO QD  11  . lisinopril-hydrochlorothiazide (PRINZIDE,ZESTORETIC) 20-12.5 MG tablet TK 1 T PO QD  5  . metoprolol succinate (TOPROL XL) 50 MG 24 hr tablet Take 1 tablet (50 mg total) by mouth daily. Take with or immediately following a meal. 90 tablet 1  . omeprazole (PRILOSEC) 20 MG capsule TK 1 C PO QD BEFORE A MEAL  5  . [DISCONTINUED] lisinopril (PRINIVIL,ZESTRIL) 20 MG tablet Take 1 tablet (20 mg total) by mouth daily. 30 tablet 2  . [DISCONTINUED] losartan (COZAAR) 25 MG tablet Take 1 tablet (25 mg total) by mouth daily. 30 tablet 0   No current facility-administered medications on file  prior to visit.   No family history on file. Social History   Social History  . Marital Status: Married    Spouse Name: N/A  . Number of Children: N/A  . Years of Education: N/A   Occupational History  . Not on file.   Social History Main Topics  . Smoking status: Never Smoker   . Smokeless tobacco: Not on file  . Alcohol Use: No  . Drug Use: No  . Sexual Activity: Not on file   Other Topics Concern  . Not on file   Social History Narrative    Review of Systems: Constitutional: Negative for fever, chills, appetite change, weight loss,  Fatigue. Skin: Negative for rashes or lesions of concern. HENT: Negative for ear pain, ear discharge.nose bleeds Eyes: Negative for pain, discharge, redness, itching and visual disturbance. Neck: Negative for pain, stiffness Respiratory: Negative for cough, shortness of breath,   Cardiovascular: Negative for chest pain, palpitations and leg swelling. Gastrointestinal: Positive for abdominal pain. Negative fornausea, vomiting, diarrhea, constipations Genitourinary: Negative for dysuria, urgency, frequency, hematuria,  Musculoskeletal: Negative for back pain, joint pain, joint  swelling, and gait problem.Negative for weakness. Neurological: Negative for dizziness, tremors, seizures, syncope,   light-headedness, numbness and headaches.  Hematological: Negative for easy bruising or bleeding Psychiatric/Behavioral: Negative for depression, anxiety, decreased concentration, confusion   Objective:   Filed Vitals:  04/05/15 1432  BP: 124/83  Pulse: 97  Temp: 98.1 F (36.7 C)  Resp: 18    Physical Exam: Constitutional: Patient appears well-developed and well-nourished. No distress. HENT: Normocephalic, atraumatic, External right and left ear normal. Oropharynx is clear and moist.  Eyes: Conjunctivae and EOM are normal. PERRLA, no scleral icterus. Neck: Normal ROM. Neck supple. No lymphadenopathy, No thyromegaly. CVS: RRR, S1/S2 +, no  murmurs, no gallops, no rubs Pulmonary: Effort and breath sounds normal, no stridor, rhonchi, wheezes, rales.  Abdominal: Soft. Normoactive BS,, no distension, rebound or guarding. There is a generalized tenderness in the mid and lower abdomen. Musculoskeletal: Normal range of motion. No edema and no tenderness.  Neuro: Alert.Normal muscle tone coordination. Non-focal Skin: Skin is warm and dry. No rash noted. Not diaphoretic. No erythema. No pallor. Psychiatric: Normal mood and affect. Behavior, judgment, thought content normal.  Lab Results  Component Value Date   WBC 9.4 03/15/2015   HGB 14.5 03/15/2015   HCT 43.8 03/15/2015   MCV 87.1 03/15/2015   PLT 209 03/15/2015   Lab Results  Component Value Date   CREATININE 1.48* 03/15/2015   BUN 28* 03/15/2015   NA 142 03/15/2015   K 4.7 03/15/2015   CL 102 03/15/2015   CO2 24 03/15/2015    No results found for: HGBA1C Lipid Panel     Component Value Date/Time   CHOL 174 03/15/2015 1036   TRIG 145 03/15/2015 1036   HDL 44 03/15/2015 1036   CHOLHDL 4.0 03/15/2015 1036   VLDL 29 03/15/2015 1036   LDLCALC 101 03/15/2015 1036       Assessment and plan:   1. Generalized abdominal pain  - CT Abdomen Pelvis W Contrast; Future - Helicobacter pylori special antigen; Future   Return if symptoms worsen or fail to improve.  The patient was given clear instructions to go to ER or return to medical center if symptoms don't improve, worsen or new problems develop. The patient verbalized understanding.    Micheline Chapman FNP  04/05/2015, 3:48 PM

## 2015-04-05 NOTE — Patient Instructions (Signed)
Need to go to [facility] for CT of abdomen Need to collect stool specimen and return to [place].

## 2015-04-09 ENCOUNTER — Encounter: Payer: Self-pay | Admitting: Family Medicine

## 2015-04-09 ENCOUNTER — Other Ambulatory Visit: Payer: Medicaid Other

## 2015-04-09 DIAGNOSIS — R079 Chest pain, unspecified: Secondary | ICD-10-CM | POA: Insufficient documentation

## 2015-04-10 ENCOUNTER — Encounter (HOSPITAL_COMMUNITY): Payer: Self-pay

## 2015-04-10 ENCOUNTER — Ambulatory Visit (HOSPITAL_COMMUNITY)
Admission: RE | Admit: 2015-04-10 | Discharge: 2015-04-10 | Disposition: A | Discharge status: Home / Self Care | Payer: Medicaid Other | Source: Ambulatory Visit | Attending: Family Medicine | Admitting: Family Medicine

## 2015-04-10 DIAGNOSIS — K439 Ventral hernia without obstruction or gangrene: Secondary | ICD-10-CM | POA: Insufficient documentation

## 2015-04-10 DIAGNOSIS — D3501 Benign neoplasm of right adrenal gland: Secondary | ICD-10-CM | POA: Insufficient documentation

## 2015-04-10 DIAGNOSIS — N281 Cyst of kidney, acquired: Secondary | ICD-10-CM | POA: Insufficient documentation

## 2015-04-10 DIAGNOSIS — R1084 Generalized abdominal pain: Secondary | ICD-10-CM | POA: Insufficient documentation

## 2015-04-10 DIAGNOSIS — R140 Abdominal distension (gaseous): Secondary | ICD-10-CM | POA: Diagnosis not present

## 2015-04-10 DIAGNOSIS — R918 Other nonspecific abnormal finding of lung field: Secondary | ICD-10-CM | POA: Insufficient documentation

## 2015-04-10 DIAGNOSIS — N329 Bladder disorder, unspecified: Secondary | ICD-10-CM | POA: Insufficient documentation

## 2015-04-10 LAB — HELICOBACTER PYLORI  SPECIAL ANTIGEN: H. PYLORI ANTIGEN STOOL: NOT DETECTED

## 2015-04-10 MED ORDER — IOHEXOL 300 MG/ML  SOLN
100.0000 mL | Freq: Once | INTRAMUSCULAR | Status: AC | PRN
  Administered 2015-04-10: 100 mL via INTRAVENOUS

## 2015-04-23 ENCOUNTER — Emergency Department (HOSPITAL_COMMUNITY)
Admission: EM | Admit: 2015-04-23 | Discharge: 2015-04-23 | Disposition: A | Discharge status: Home / Self Care | Payer: Medicaid Other | Source: Home / Self Care | Attending: Family Medicine | Admitting: Family Medicine

## 2015-04-23 ENCOUNTER — Encounter (HOSPITAL_COMMUNITY): Payer: Self-pay | Admitting: Emergency Medicine

## 2015-04-23 DIAGNOSIS — H5713 Ocular pain, bilateral: Secondary | ICD-10-CM

## 2015-04-23 NOTE — ED Notes (Signed)
The patient presented to the [facility] with a complaint of bilateral eye pain x 4 months.

## 2015-04-23 NOTE — ED Provider Notes (Signed)
CSN: [id number]     Arrival date & time 04/23/15  1551 History   First MD Initiated Contact with Patient 04/23/15 1740     Chief Complaint  Patient presents with  . Eye Pain   (Consider location/radiation/quality/duration/timing/severity/associated sxs/prior Treatment) HPI history obtain through interpreter  Burning and redness of eyes more than 2 months. Difficulty seeing in the sun. Irritation of eyes, not new, present for several months History of elevated eye pressure. Recent arrival from [place]   History reviewed. No pertinent past medical history. History reviewed. No pertinent past surgical history. History reviewed. No pertinent family history. Social History  Substance Use Topics  . Smoking status: Never Smoker   . Smokeless tobacco: None  . Alcohol Use: No    Review of Systems  ROS +'veeye irritation  Denies: HEADACHE, NAUSEA, ABDOMINAL PAIN, CHEST PAIN, CONGESTION, DYSURIA, SHORTNESS OF BREATH  Allergies  Review of patient's allergies indicates no known allergies.  Home Medications   Prior to Admission medications   Medication Sig Start Date End Date Taking? Authorizing Provider  amLODipine (NORVASC) 10 MG tablet Take 1 tablet (10 mg total) by mouth daily. 03/15/15   Micheline Chapman, NP  ASPIRIN LOW DOSE 81 MG EC tablet TK 1 T PO QD 02/28/15   Historical Provider, MD  lisinopril-hydrochlorothiazide (PRINZIDE,ZESTORETIC) 20-12.5 MG tablet TK 1 T PO QD 02/28/15   Historical Provider, MD  metoprolol succinate (TOPROL XL) 50 MG 24 hr tablet Take 1 tablet (50 mg total) by mouth daily. Take with or immediately following a meal. 03/22/15   Micheline Chapman, NP  omeprazole (PRILOSEC) 20 MG capsule TK 1 C PO QD BEFORE A MEAL 02/28/15   Historical Provider, MD   Meds Ordered and Administered this Visit  Medications - No data to display  BP 143/91 mmHg  Pulse 78  Temp(Src) 98.2 F (36.8 C) (Oral)  Resp 16  SpO2 99% No data found.   Physical Exam  Constitutional:  He appears well-developed and well-nourished. No distress.  HENT:  Head: Normocephalic and atraumatic.  Eyes: Conjunctivae and EOM are normal. Pupils are equal, round, and reactive to light.  Fundoscopic exam:      The right eye shows no hemorrhage. The right eye shows venous pulsations.       The left eye shows no hemorrhage. The left eye shows venous pulsations.  Globes are soft. Unable to tono pen for pressures    ED Course  Procedures (including critical care time)  Labs Review Labs Reviewed - No data to display  Imaging Review No results found.   Visual Acuity Review  Right Eye Distance:   Left Eye Distance:   Bilateral Distance:    Right Eye Near:   Left Eye Near:    Bilateral Near:         MDM   1. Eye pain, bilateral    Refer to opthalmology for review. Can be done as OP.     Konrad Felix, [place] 04/24/15 [id number]

## 2015-04-23 NOTE — Discharge Instructions (Signed)
You will need to have your pressures checked by the eye doctor and if needed appropriate medication started.

## 2015-04-24 ENCOUNTER — Encounter: Payer: Self-pay | Admitting: Family Medicine

## 2015-05-09 ENCOUNTER — Other Ambulatory Visit: Payer: Self-pay | Admitting: Infectious Disease

## 2015-05-09 ENCOUNTER — Ambulatory Visit
Admission: RE | Admit: 2015-05-09 | Discharge: 2015-05-09 | Disposition: A | Discharge status: Home / Self Care | Payer: No Typology Code available for payment source | Source: Ambulatory Visit | Attending: Infectious Disease | Admitting: Infectious Disease

## 2015-05-09 DIAGNOSIS — Z111 Encounter for screening for respiratory tuberculosis: Secondary | ICD-10-CM

## 2015-05-17 ENCOUNTER — Other Ambulatory Visit: Payer: Self-pay | Admitting: Cardiology

## 2015-05-17 DIAGNOSIS — R079 Chest pain, unspecified: Secondary | ICD-10-CM

## 2015-05-25 ENCOUNTER — Encounter (HOSPITAL_COMMUNITY)
Admission: RE | Admit: 2015-05-25 | Discharge: 2015-05-25 | Disposition: A | Discharge status: Home / Self Care | Payer: Medicaid Other | Source: Ambulatory Visit | Attending: Cardiology | Admitting: Cardiology

## 2015-05-25 DIAGNOSIS — R079 Chest pain, unspecified: Secondary | ICD-10-CM | POA: Insufficient documentation

## 2015-05-25 MED ORDER — TECHNETIUM TC 99M SESTAMIBI GENERIC - CARDIOLITE
10.0000 | Freq: Once | INTRAVENOUS | Status: AC | PRN
  Administered 2015-05-25: 10 via INTRAVENOUS

## 2015-05-25 MED ORDER — TECHNETIUM TC 99M SESTAMIBI GENERIC - CARDIOLITE
30.0000 | Freq: Once | INTRAVENOUS | Status: AC | PRN
  Administered 2015-05-25: 30 via INTRAVENOUS

## 2015-05-25 MED ORDER — REGADENOSON 0.4 MG/5ML IV SOLN
0.4000 mg | Freq: Once | INTRAVENOUS | Status: AC
  Administered 2015-05-25: 0.4 mg via INTRAVENOUS

## 2015-05-25 MED ORDER — REGADENOSON 0.4 MG/5ML IV SOLN
INTRAVENOUS | Status: AC
  Filled 2015-05-25: qty 5

## 2015-06-07 ENCOUNTER — Encounter: Payer: Self-pay | Admitting: *Deleted

## 2015-06-13 ENCOUNTER — Ambulatory Visit (HOSPITAL_COMMUNITY)
Admission: EM | Admit: 2015-06-13 | Discharge: 2015-06-13 | Disposition: A | Discharge status: Home / Self Care | Payer: Medicaid Other | Attending: Family Medicine | Admitting: Family Medicine

## 2015-06-13 ENCOUNTER — Encounter (HOSPITAL_COMMUNITY): Payer: Self-pay | Admitting: Emergency Medicine

## 2015-06-13 DIAGNOSIS — R5382 Chronic fatigue, unspecified: Secondary | ICD-10-CM

## 2015-06-13 DIAGNOSIS — M25512 Pain in left shoulder: Secondary | ICD-10-CM

## 2015-06-13 DIAGNOSIS — H5713 Ocular pain, bilateral: Secondary | ICD-10-CM

## 2015-06-13 DIAGNOSIS — M67912 Unspecified disorder of synovium and tendon, left shoulder: Secondary | ICD-10-CM | POA: Diagnosis not present

## 2015-06-13 MED ORDER — NAPROXEN 375 MG PO TBEC: DELAYED_RELEASE_TABLET | ORAL | Status: DC

## 2015-06-13 NOTE — ED Provider Notes (Signed)
CSN: [id number]     Arrival date & time 06/13/15  1300 History   First MD Initiated Contact with Patient 06/13/15 1312     Chief Complaint  Patient presents with  . Shoulder Pain  . Eye Problem   (Consider location/radiation/quality/duration/timing/severity/associated sxs/prior Treatment) HPI Comments: 60 year old male from [place] is complaining of chronic left shoulder pain for 3 years. He states it started when he was residing in [place]. He states there has been no change. It is not worse. He is unable to fully abduct his left shoulder. Denies any known injury or traumatic event. It is a chronic ongoing condition. 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   History reviewed. No pertinent past medical history. History reviewed. No pertinent past surgical history. No family history on file. Social History  Substance Use Topics  . Smoking status: Never Smoker   . Smokeless tobacco: None  . Alcohol Use: No    Review of Systems  Constitutional: Positive for fatigue. Negative for fever and activity change.  HENT: Negative.   Eyes: Positive for pain and visual disturbance. Negative for discharge.  Respiratory: Negative.   Cardiovascular: Negative.   Musculoskeletal: Positive for arthralgias. Negative for joint swelling.  Skin: Negative.   Neurological: Negative for tremors, facial asymmetry, speech difficulty and numbness.    Allergies  Review of patient's allergies indicates no known allergies.  Home Medications   Prior to Admission medications   Medication Sig Start Date End Date Taking? Authorizing Provider  amLODipine (NORVASC) 10 MG tablet Take 1 tablet (10 mg total) by mouth daily. 03/15/15   Micheline Chapman, NP  ASPIRIN LOW DOSE 81 MG EC tablet TK 1 T PO QD 02/28/15   Historical Provider, MD  lisinopril-hydrochlorothiazide (PRINZIDE,ZESTORETIC) 20-12.5 MG tablet TK 1 T PO QD 02/28/15    Historical Provider, MD  metoprolol succinate (TOPROL XL) 50 MG 24 hr tablet Take 1 tablet (50 mg total) by mouth daily. Take with or immediately following a meal. 03/22/15   Micheline Chapman, NP  Naproxen 375 MG TBEC 1 po bid with food prn shoulder pain 06/13/15   Janne Napoleon, NP  omeprazole (PRILOSEC) 20 MG capsule TK 1 C PO QD BEFORE A MEAL 02/28/15   Historical Provider, MD   Meds Ordered and Administered this Visit  Medications - No data to display  BP 139/93 mmHg  Pulse 75  Temp(Src) 97.8 F (36.6 C) (Oral)  Resp 16  SpO2 98% No data found.   Physical Exam  Constitutional: He appears well-developed and well-nourished. No distress.  Eyes: Conjunctivae and EOM are normal.  Neck: Normal range of motion. Neck supple.  Cardiovascular: Normal rate.   Pulmonary/Chest: Effort normal. No respiratory distress.  Musculoskeletal: He exhibits tenderness. He exhibits no edema.  No shoulder asymmetry. There is primarily pain to the posterior shoulder joint with abduction beyond 70. There is tenderness to the posterior joint line. There is no tenderness to the before meals joint or IP superior point of the glenoid. No anterior joint line pain. Pain is exacerbated with internal rotation and less with external rotation. He is unable to place his left arm behind his back due to pain. No deltoid tenderness, pain or swelling. No tenderness to the clavicle, acromioclavicular joint. Distal neurovascular motor sensory is intact.  Lymphadenopathy:    He has no cervical adenopathy.  Neurological: He is alert. He  exhibits normal muscle tone.  Skin: Skin is warm and dry.  Psychiatric: He has a normal mood and affect.  Nursing note and vitals reviewed.   ED Course  Procedures (including critical care time)  Labs Review Labs Reviewed - No data to display  Imaging Review No results found.   Visual Acuity Review  Right Eye Distance:   Left Eye Distance:   Bilateral Distance:    Right Eye  Near:   Left Eye Near:    Bilateral Near:         MDM   1. Tendinopathy of rotator cuff, left   2. Eye pain, bilateral   3. Chronic fatigue    Rotator cuff tendinopathy, treat with ice and Naprosyn at this time. A sling would probably not be of benefit at this time and possibly contributed to frozen shoulder. he will need to be referred to orthopedics. He was referred to ophthalmology on his last visit to the urgent care for eye complaints. Other chronic complaints should be addressed by his PCP. Meds ordered this encounter  Medications  . Naproxen 375 MG TBEC    Sig: 1 po bid with food prn shoulder pain    Dispense:  20 each    Refill:  0    Order Specific Question:  Supervising Provider    Answer:  Billy Fischer [[id number]]       Janne Napoleon, NP 06/13/15 1352  Janne Napoleon, NP 06/13/15 1353

## 2015-06-13 NOTE — ED Notes (Signed)
Patient reports left shoulder pain for 2 years. Pain worse with movement.  Patient is right handed.  Patient pushes tables  Eyes are red and painful.  Reports this issue for 3 years.  Patient reports he wears prescription glasses for reading.  Reports he got glasses in [place] and was told something about "high pressure in eyes"

## 2015-06-13 NOTE — Congregational Nurse Program (Signed)
Congregational Nurse Program Note  Date of Encounter: 06/13/2015  Past Medical History: No past medical history on file.  Encounter Details:     CNP Questionnaire - 06/13/15 1439    Patient Demographics   Is this a new or existing patient? Existing   Patient is considered a/an Refugee   Race African   Patient Assistance   Location of Patient Assistance Not Applicable   Patient's financial/insurance status Medicaid   Uninsured Patient No   Patient referred to apply for the following financial assistance Not Applicable   Food insecurities addressed Not Applicable   Transportation assistance Yes   Type of Assistance Taxi Voucher Given   Assistance securing medications No   Educational health offerings Navigating the healthcare system   Encounter Details   Primary purpose of visit Acute Illness/Condition Visit   Was an Emergency Department visit averted? Not Applicable   Does patient have a medical provider? Yes   Patient referred to Urgent Care   Was a mental health screening completed? (GAINS tool) No   Does patient have dental issues? No   Does patient have vision issues? Yes   Was a vision referral made? Yes   Since previous encounter, have you referred patient for abnormal blood pressure that resulted in a new diagnosis or medication change? No   Since previous encounter, have you referred patient for abnormal blood glucose that resulted in a new diagnosis or medication change? No     Office visit at [facility] for this Thomas Bailey, Thomas Bailey speaking man with complaint of bilateral eye pain with redness. Denies itching or allergy. History of Glaucoma diagnosed in [place] during immigration process. Also experiencing sever pain and immobility in left shoulder with slight movement. Hx of shoulder joint pain since three years. Currently taking meds for hypertension. Blood pressure improved since original visit.  Requesting assistance with medical care today. Plan: Refer  to Urgent Care evaluation.  Assist with Ophthalmology referral to [facility] clinic. Jannetta Quint, RN/CN. [phone]    Plan:

## 2015-06-13 NOTE — ED Notes (Signed)
 congregational nurse sent patient to urgent care.  Referral form sent for scanning into medical record

## 2015-06-26 DIAGNOSIS — M25512 Pain in left shoulder: Secondary | ICD-10-CM

## 2015-06-26 DIAGNOSIS — H5713 Ocular pain, bilateral: Secondary | ICD-10-CM

## 2015-07-12 ENCOUNTER — Emergency Department (HOSPITAL_COMMUNITY): Payer: Medicaid Other

## 2015-07-12 ENCOUNTER — Emergency Department (HOSPITAL_COMMUNITY)
Admission: EM | Admit: 2015-07-12 | Discharge: 2015-07-12 | Disposition: A | Discharge status: Home / Self Care | Payer: Medicaid Other | Attending: Emergency Medicine | Admitting: Emergency Medicine

## 2015-07-12 ENCOUNTER — Encounter (HOSPITAL_COMMUNITY): Payer: Self-pay | Admitting: Emergency Medicine

## 2015-07-12 DIAGNOSIS — S4992XA Unspecified injury of left shoulder and upper arm, initial encounter: Secondary | ICD-10-CM | POA: Diagnosis present

## 2015-07-12 DIAGNOSIS — I10 Essential (primary) hypertension: Secondary | ICD-10-CM | POA: Insufficient documentation

## 2015-07-12 DIAGNOSIS — Z79899 Other long term (current) drug therapy: Secondary | ICD-10-CM | POA: Insufficient documentation

## 2015-07-12 DIAGNOSIS — Y998 Other external cause status: Secondary | ICD-10-CM | POA: Diagnosis not present

## 2015-07-12 DIAGNOSIS — X58XXXA Exposure to other specified factors, initial encounter: Secondary | ICD-10-CM | POA: Diagnosis not present

## 2015-07-12 DIAGNOSIS — Y9289 Other specified places as the place of occurrence of the external cause: Secondary | ICD-10-CM | POA: Diagnosis not present

## 2015-07-12 DIAGNOSIS — S43402A Unspecified sprain of left shoulder joint, initial encounter: Secondary | ICD-10-CM | POA: Insufficient documentation

## 2015-07-12 DIAGNOSIS — Y9389 Activity, other specified: Secondary | ICD-10-CM | POA: Insufficient documentation

## 2015-07-12 HISTORY — DX: Essential (primary) hypertension: I10

## 2015-07-12 MED ORDER — TRAMADOL HCL 50 MG PO TABS: 50.0000 mg | ORAL_TABLET | Freq: Four times a day (QID) | ORAL | Status: DC | PRN

## 2015-07-12 NOTE — Discharge Instructions (Signed)
Shoulder Sprain A shoulder sprain is a partial or complete tear in one of the tough, fiber-like tissues (ligaments) in the shoulder. The ligaments in the shoulder help to hold the shoulder in place. CAUSES This condition may be caused by:  A fall.  A hit to the shoulder.  A twist of the arm. RISK FACTORS This condition is more likely to develop in:  People who play sports.  People who have problems with balance or coordination. SYMPTOMS Symptoms of this condition include:  Pain when moving the shoulder.  Limited ability to move the shoulder.  Swelling and tenderness on top of the shoulder.  Warmth in the shoulder.  A change in the shape of the shoulder.  Redness or bruising on the shoulder. DIAGNOSIS This condition is diagnosed with a physical exam. During the exam, you may be asked to do simple exercises with your shoulder. You may also have imaging tests, such as X-rays, MRI, or a CT scan. These tests can show how severe the sprain is. TREATMENT This condition may be treated with:  Rest.  Pain medicine.  Ice.  A sling or brace. This is used to keep the arm still while the shoulder is healing.  Physical therapy or rehabilitation exercises. These help to improve the range of motion and strength of the shoulder.  Surgery (rare). Surgery may be needed if the sprain caused a joint to become unstable. Surgery may also be needed to reduce pain. Some people may develop ongoing shoulder pain or lose some range of motion in the shoulder. However, most people do not develop long-term problems. HOME CARE INSTRUCTIONS  Rest.  Take over-the-counter and prescription medicines only as told by your health care provider.  If directed, apply ice to the area:  Put ice in a plastic bag.  Place a towel between your skin and the bag.  Leave the ice on for 20 minutes, 2-3 times per day.  If you were given a shoulder sling or brace:  Wear it as told.  Remove it to shower or  bathe.  Move your arm only as much as told by your health care provider, but keep your hand moving to prevent swelling.  If you were shown how to do any exercises, do them as told by your health care provider.  Keep all follow-up visits as told by your health care provider. This is important. SEEK MEDICAL CARE IF:  Your pain gets worse.  Your pain is not relieved with medicines.  You have increased redness or swelling. SEEK IMMEDIATE MEDICAL CARE IF:  You have a fever.  You cannot move your arm or shoulder.  You develop numbness or tingling in your arms, hands, or fingers.   This information is not intended to replace advice given to you by your health care provider. Make sure you discuss any questions you have with your health care provider.   Document Released: 07/06/2008 Document Revised: 11/08/2014 Document Reviewed: 06/12/2014 [employer] Interactive Patient Education 2016 [employer] Impingement Syndrome, Rotator Cuff, Bursitis With Rehab Impingement syndrome is a condition that involves inflammation of the tendons of the rotator cuff and the subacromial bursa, that causes pain in the shoulder. The rotator cuff consists of four tendons and muscles that control much of the shoulder and upper arm function. The subacromial bursa is a fluid filled sac that helps reduce friction between the rotator cuff and one of the bones of the shoulder (acromion). Impingement syndrome is usually an overuse injury that causes swelling of the  bursa (bursitis), swelling of the tendon (tendonitis), and/or a tear of the tendon (strain). Strains are classified into three categories. Grade 1 strains cause pain, but the tendon is not lengthened. Grade 2 strains include a lengthened ligament, due to the ligament being stretched or partially ruptured. With grade 2 strains there is still function, although the function may be decreased. Grade 3 strains include a complete tear of the tendon or muscle, and  function is usually impaired. SYMPTOMS   Pain around the shoulder, often at the outer portion of the upper arm.  Pain that gets worse with shoulder function, especially when reaching overhead or lifting.  Sometimes, aching when not using the arm.  Pain that wakes you up at night.  Sometimes, tenderness, swelling, warmth, or redness over the affected area.  Loss of strength.  Limited motion of the shoulder, especially reaching behind the back (to the back pocket or to unhook bra) or across your body.  Crackling sound (crepitation) when moving the arm.  Biceps tendon pain and inflammation (in the front of the shoulder). Worse when bending the elbow or lifting. CAUSES  Impingement syndrome is often an overuse injury, in which chronic (repetitive) motions cause the tendons or bursa to become inflamed. A strain occurs when a force is paced on the tendon or muscle that is greater than it can withstand. Common mechanisms of injury include: Stress from sudden increase in duration, frequency, or intensity of training.  Direct hit (trauma) to the shoulder.  Aging, erosion of the tendon with normal use.  Bony bump on shoulder (acromial spur). RISK INCREASES WITH:  Contact sports (football, wrestling, boxing).  Throwing sports (baseball, tennis, volleyball).  Weightlifting and bodybuilding.  Heavy labor.  Previous injury to the rotator cuff, including impingement.  Poor shoulder strength and flexibility.  Failure to warm up properly before activity.  Inadequate protective equipment.  Old age.  Bony bump on shoulder (acromial spur). PREVENTION   Warm up and stretch properly before activity.  Allow for adequate recovery between workouts.  Maintain physical fitness:  Strength, flexibility, and endurance.  Cardiovascular fitness.  Learn and use proper exercise technique. PROGNOSIS  If treated properly, impingement syndrome usually goes away within 6 weeks. Sometimes  surgery is required.  RELATED COMPLICATIONS   Longer healing time if not properly treated, or if not given enough time to heal.  Recurring symptoms, that result in a chronic condition.  Shoulder stiffness, frozen shoulder, or loss of motion.  Rotator cuff tendon tear.  Recurring symptoms, especially if activity is resumed too soon, with overuse, with a direct blow, or when using poor technique. TREATMENT  Treatment first involves the use of ice and medicine, to reduce pain and inflammation. The use of strengthening and stretching exercises may help reduce pain with activity. These exercises may be performed at home or with a therapist. If non-surgical treatment is unsuccessful after more than 6 months, surgery may be advised. After surgery and rehabilitation, activity is usually possible in 3 months.  MEDICATION  If pain medicine is needed, nonsteroidal anti-inflammatory medicines (aspirin and ibuprofen), or other minor pain relievers (acetaminophen), are often advised.  Do not take pain medicine for 7 days before surgery.  Prescription pain relievers may be given, if your caregiver thinks they are needed. Use only as directed and only as much as you need.  Corticosteroid injections may be given by your caregiver. These injections should be reserved for the most serious cases, because they may only be given a certain number  of times. HEAT AND COLD  Cold treatment (icing) should be applied for 10 to 15 minutes every 2 to 3 hours for inflammation and pain, and immediately after activity that aggravates your symptoms. Use ice packs or an ice massage.  Heat treatment may be used before performing stretching and strengthening activities prescribed by your caregiver, physical therapist, or athletic trainer. Use a heat pack or a warm water soak. SEEK MEDICAL CARE IF:   Symptoms get worse or do not improve in 4 to 6 weeks, despite treatment.  New, unexplained symptoms develop. (Drugs used in  treatment may produce side effects.) EXERCISES  RANGE OF MOTION (ROM) AND STRETCHING EXERCISES - Impingement Syndrome (Rotator Cuff  Tendinitis, Bursitis) These exercises may help you when beginning to rehabilitate your injury. Your symptoms may go away with or without further involvement from your physician, physical therapist or athletic trainer. While completing these exercises, remember:   Restoring tissue flexibility helps normal motion to return to the joints. This allows healthier, less painful movement and activity.  An effective stretch should be held for at least 30 seconds.  A stretch should never be painful. You should only feel a gentle lengthening or release in the stretched tissue. STRETCH - Flexion, Standing  Stand with good posture. With an underhand grip on your right / left hand, and an overhand grip on the opposite hand, grasp a broomstick or cane so that your hands are a little more than shoulder width apart.  Keeping your right / left elbow straight and shoulder muscles relaxed, push the stick with your opposite hand, to raise your right / left arm in front of your body and then overhead. Raise your arm until you feel a stretch in your right / left shoulder, but before you have increased shoulder pain.  Try to avoid shrugging your right / left shoulder as your arm rises, by keeping your shoulder blade tucked down and toward your mid-back spine. Hold for __________ seconds.  Slowly return to the starting position. Repeat __________ times. Complete this exercise __________ times per day. STRETCH - Abduction, Supine  Lie on your back. With an underhand grip on your right / left hand and an overhand grip on the opposite hand, grasp a broomstick or cane so that your hands are a little more than shoulder width apart.  Keeping your right / left elbow straight and your shoulder muscles relaxed, push the stick with your opposite hand, to raise your right / left arm out to the  side of your body and then overhead. Raise your arm until you feel a stretch in your right / left shoulder, but before you have increased shoulder pain.  Try to avoid shrugging your right / left shoulder as your arm rises, by keeping your shoulder blade tucked down and toward your mid-back spine. Hold for __________ seconds.  Slowly return to the starting position. Repeat __________ times. Complete this exercise __________ times per day. ROM - Flexion, Active-Assisted  Lie on your back. You may bend your knees for comfort.  Grasp a broomstick or cane so your hands are about shoulder width apart. Your right / left hand should grip the end of the stick, so that your hand is positioned "thumbs-up," as if you were about to shake hands.  Using your healthy arm to lead, raise your right / left arm overhead, until you feel a gentle stretch in your shoulder. Hold for __________ seconds.  Use the stick to assist in returning your right /  left arm to its starting position. Repeat __________ times. Complete this exercise __________ times per day.  ROM - Internal Rotation, Supine   Lie on your back on a firm surface. Place your right / left elbow about 60 degrees away from your side. Elevate your elbow with a folded towel, so that the elbow and shoulder are the same height.  Using a broomstick or cane and your strong arm, pull your right / left hand toward your body until you feel a gentle stretch, but no increase in your shoulder pain. Keep your shoulder and elbow in place throughout the exercise.  Hold for __________ seconds. Slowly return to the starting position. Repeat __________ times. Complete this exercise __________ times per day. STRETCH - Internal Rotation  Place your right / left hand behind your back, palm up.  Throw a towel or belt over your opposite shoulder. Grasp the towel with your right / left hand.  While keeping an upright posture, gently pull up on the towel, until you feel a  stretch in the front of your right / left shoulder.  Avoid shrugging your right / left shoulder as your arm rises, by keeping your shoulder blade tucked down and toward your mid-back spine.  Hold for __________ seconds. Release the stretch, by lowering your healthy hand. Repeat __________ times. Complete this exercise __________ times per day. ROM - Internal Rotation   Using an underhand grip, grasp a stick behind your back with both hands.  While standing upright with good posture, slide the stick up your back until you feel a mild stretch in the front of your shoulder.  Hold for __________ seconds. Slowly return to your starting position. Repeat __________ times. Complete this exercise __________ times per day.  STRETCH - Posterior Shoulder Capsule   Stand or sit with good posture. Grasp your right / left elbow and draw it across your chest, keeping it at the same height as your shoulder.  Pull your elbow, so your upper arm comes in closer to your chest. Pull until you feel a gentle stretch in the back of your shoulder.  Hold for __________ seconds. Repeat __________ times. Complete this exercise __________ times per day. STRENGTHENING EXERCISES - Impingement Syndrome (Rotator Cuff Tendinitis, Bursitis) These exercises may help you when beginning to rehabilitate your injury. They may resolve your symptoms with or without further involvement from your physician, physical therapist or athletic trainer. While completing these exercises, remember:  Muscles can gain both the endurance and the strength needed for everyday activities through controlled exercises.  Complete these exercises as instructed by your physician, physical therapist or athletic trainer. Increase the resistance and repetitions only as guided.  You may experience muscle soreness or fatigue, but the pain or discomfort you are trying to eliminate should never worsen during these exercises. If this pain does get worse, stop  and make sure you are following the directions exactly. If the pain is still present after adjustments, discontinue the exercise until you can discuss the trouble with your clinician.  During your recovery, avoid activity or exercises which involve actions that place your injured hand or elbow above your head or behind your back or head. These positions stress the tissues which you are trying to heal. STRENGTH - Scapular Depression and Adduction   With good posture, sit on a firm chair. Support your arms in front of you, with pillows, arm rests, or on a table top. Have your elbows in line with the sides of your body.  Gently draw your shoulder blades down and toward your mid-back spine. Gradually increase the tension, without tensing the muscles along the top of your shoulders and the back of your neck.  Hold for __________ seconds. Slowly release the tension and relax your muscles completely before starting the next repetition.  After you have practiced this exercise, remove the arm support and complete the exercise in standing as well as sitting position. Repeat __________ times. Complete this exercise __________ times per day.  STRENGTH - Shoulder Abductors, Isometric  With good posture, stand or sit about 4-6 inches from a wall, with your right / left side facing the wall.  Bend your right / left elbow. Gently press your right / left elbow into the wall. Increase the pressure gradually, until you are pressing as hard as you can, without shrugging your shoulder or increasing any shoulder discomfort.  Hold for __________ seconds.  Release the tension slowly. Relax your shoulder muscles completely before you begin the next repetition. Repeat __________ times. Complete this exercise __________ times per day.  STRENGTH - External Rotators, Isometric  Keep your right / left elbow at your side and bend it 90 degrees.  Step into a door frame so that the outside of your right / left wrist can  press against the door frame without your upper arm leaving your side.  Gently press your right / left wrist into the door frame, as if you were trying to swing the back of your hand away from your stomach. Gradually increase the tension, until you are pressing as hard as you can, without shrugging your shoulder or increasing any shoulder discomfort.  Hold for __________ seconds.  Release the tension slowly. Relax your shoulder muscles completely before you begin the next repetition. Repeat __________ times. Complete this exercise __________ times per day.  STRENGTH - Supraspinatus   Stand or sit with good posture. Grasp a __________ weight, or an exercise band or tubing, so that your hand is "thumbs-up," like you are shaking hands.  Slowly lift your right / left arm in a "V" away from your thigh, diagonally into the space between your side and straight ahead. Lift your hand to shoulder height or as far as you can, without increasing any shoulder pain. At first, many people do not lift their hands above shoulder height.  Avoid shrugging your right / left shoulder as your arm rises, by keeping your shoulder blade tucked down and toward your mid-back spine.  Hold for __________ seconds. Control the descent of your hand, as you slowly return to your starting position. Repeat __________ times. Complete this exercise __________ times per day.  STRENGTH - External Rotators  Secure a rubber exercise band or tubing to a fixed object (table, pole) so that it is at the same height as your right / left elbow when you are standing or sitting on a firm surface.  Stand or sit so that the secured exercise band is at your uninjured side.  Bend your right / left elbow 90 degrees. Place a folded towel or small pillow under your right / left arm, so that your elbow is a few inches away from your side.  Keeping the tension on the exercise band, pull it away from your body, as if pivoting on your elbow. Be sure  to keep your body steady, so that the movement is coming only from your rotating shoulder.  Hold for __________ seconds. Release the tension in a controlled manner, as you return to the  starting position. Repeat __________ times. Complete this exercise __________ times per day.  STRENGTH - Internal Rotators   Secure a rubber exercise band or tubing to a fixed object (table, pole) so that it is at the same height as your right / left elbow when you are standing or sitting on a firm surface.  Stand or sit so that the secured exercise band is at your right / left side.  Bend your elbow 90 degrees. Place a folded towel or small pillow under your right / left arm so that your elbow is a few inches away from your side.  Keeping the tension on the exercise band, pull it across your body, toward your stomach. Be sure to keep your body steady, so that the movement is coming only from your rotating shoulder.  Hold for __________ seconds. Release the tension in a controlled manner, as you return to the starting position. Repeat __________ times. Complete this exercise __________ times per day.  STRENGTH - Scapular Protractors, Standing   Stand arms length away from a wall. Place your hands on the wall, keeping your elbows straight.  Begin by dropping your shoulder blades down and toward your mid-back spine.  To strengthen your protractors, keep your shoulder blades down, but slide them forward on your rib cage. It will feel as if you are lifting the back of your rib cage away from the wall. This is a subtle motion and can be challenging to complete. Ask your caregiver for further instruction, if you are not sure you are doing the exercise correctly.  Hold for __________ seconds. Slowly return to the starting position, resting the muscles completely before starting the next repetition. Repeat __________ times. Complete this exercise __________ times per day. STRENGTH - Scapular Protractors,  Supine  Lie on your back on a firm surface. Extend your right / left arm straight into the air while holding a __________ weight in your hand.  Keeping your head and back in place, lift your shoulder off the floor.  Hold for __________ seconds. Slowly return to the starting position, and allow your muscles to relax completely before starting the next repetition. Repeat __________ times. Complete this exercise __________ times per day. STRENGTH - Scapular Protractors, Quadruped  Get onto your hands and knees, with your shoulders directly over your hands (or as close as you can be, comfortably).  Keeping your elbows locked, lift the back of your rib cage up into your shoulder blades, so your mid-back rounds out. Keep your neck muscles relaxed.  Hold this position for __________ seconds. Slowly return to the starting position and allow your muscles to relax completely before starting the next repetition. Repeat __________ times. Complete this exercise __________ times per day.  STRENGTH - Scapular Retractors  Secure a rubber exercise band or tubing to a fixed object (table, pole), so that it is at the height of your shoulders when you are either standing, or sitting on a firm armless chair.  With a palm down grip, grasp an end of the band in each hand. Straighten your elbows and lift your hands straight in front of you, at shoulder height. Step back, away from the secured end of the band, until it becomes tense.  Squeezing your shoulder blades together, draw your elbows back toward your sides, as you bend them. Keep your upper arms lifted away from your body throughout the exercise.  Hold for __________ seconds. Slowly ease the tension on the band, as you reverse the directions and  return to the starting position. Repeat __________ times. Complete this exercise __________ times per day. STRENGTH - Shoulder Extensors   Secure a rubber exercise band or tubing to a fixed object (table, pole) so  that it is at the height of your shoulders when you are either standing, or sitting on a firm armless chair.  With a thumbs-up grip, grasp an end of the band in each hand. Straighten your elbows and lift your hands straight in front of you, at shoulder height. Step back, away from the secured end of the band, until it becomes tense.  Squeezing your shoulder blades together, pull your hands down to the sides of your thighs. Do not allow your hands to go behind you.  Hold for __________ seconds. Slowly ease the tension on the band, as you reverse the directions and return to the starting position. Repeat __________ times. Complete this exercise __________ times per day.  STRENGTH - Scapular Retractors and External Rotators   Secure a rubber exercise band or tubing to a fixed object (table, pole) so that it is at the height as your shoulders, when you are either standing, or sitting on a firm armless chair.  With a palm down grip, grasp an end of the band in each hand. Bend your elbows 90 degrees and lift your elbows to shoulder height, at your sides. Step back, away from the secured end of the band, until it becomes tense.  Squeezing your shoulder blades together, rotate your shoulders so that your upper arms and elbows remain stationary, but your fists travel upward to head height.  Hold for __________ seconds. Slowly ease the tension on the band, as you reverse the directions and return to the starting position. Repeat __________ times. Complete this exercise __________ times per day.  STRENGTH - Scapular Retractors and External Rotators, Rowing   Secure a rubber exercise band or tubing to a fixed object (table, pole) so that it is at the height of your shoulders, when you are either standing, or sitting on a firm armless chair.  With a palm down grip, grasp an end of the band in each hand. Straighten your elbows and lift your hands straight in front of you, at shoulder height. Step back, away  from the secured end of the band, until it becomes tense.  Step 1: Squeeze your shoulder blades together. Bending your elbows, draw your hands to your chest, as if you are rowing a boat. At the end of this motion, your hands and elbow should be at shoulder height and your elbows should be out to your sides.  Step 2: Rotate your shoulders, to raise your hands above your head. Your forearms should be vertical and your upper arms should be horizontal.  Hold for __________ seconds. Slowly ease the tension on the band, as you reverse the directions and return to the starting position. Repeat __________ times. Complete this exercise __________ times per day.  STRENGTH - Scapular Depressors  Find a sturdy chair without wheels, such as a dining room chair.  Keeping your feet on the floor, and your hands on the chair arms, lift your bottom up from the seat, and lock your elbows.  Keeping your elbows straight, allow gravity to pull your body weight down. Your shoulders will rise toward your ears.  Raise your body against gravity by drawing your shoulder blades down your back, shortening the distance between your shoulders and ears. Although your feet should always maintain contact with the floor, your feet  should progressively support less body weight, as you get stronger.  Hold for __________ seconds. In a controlled and slow manner, lower your body weight to begin the next repetition. Repeat __________ times. Complete this exercise __________ times per day.    This information is not intended to replace advice given to you by your health care provider. Make sure you discuss any questions you have with your health care provider.   Document Released: 02/17/2005 Document Revised: 03/10/2014 Document Reviewed: 06/01/2008 Elsevier Interactive Patient Education [employer].

## 2015-07-12 NOTE — ED Provider Notes (Signed)
CSN: [id number]     Arrival date & time 07/12/15  1124 History  By signing my name below, I, Emmanuella Mensah, attest that this documentation has been prepared under the direction and in the presence of Domenic Moras, PA-C. Electronically Signed: Judithann Sauger, ED Scribe. 07/12/2015. 1:08 PM.    Chief Complaint  Patient presents with  . Shoulder Pain   The history is provided by the patient. A language interpreter was used (Pt speaks Arabic. ).   HPI Comments: Thomas Bailey is a 60 y.o. male with a hx of HTN who presents to the Emergency Department complaining of gradually worsening moderate left shoulder pain s/p lifting something heavy approx. one month ago. He reports associated left wrist pain. He states that movement and lifting up his arm makes the pain worse. Pain is moderate in severity. He reports that he has been taking OTC Tylenol twice a day with no relief but he gets relief from keeping his arm still. No other alleviating factors noted. Pt denies any numbness, weakness, or any open wounds.    Past Medical History  Diagnosis Date  . Hypertension    History reviewed. No pertinent past surgical history. No family history on file. Social History  Substance Use Topics  . Smoking status: Never Smoker   . Smokeless tobacco: None  . Alcohol Use: No    Review of Systems  Musculoskeletal: Positive for arthralgias.  Skin: Negative for rash and wound.  Neurological: Negative for weakness and numbness.      Allergies  Review of patient's allergies indicates no known allergies.  Home Medications   Prior to Admission medications   Medication Sig Start Date End Date Taking? Authorizing Provider  amLODipine (NORVASC) 10 MG tablet Take 1 tablet (10 mg total) by mouth daily. 03/15/15   Micheline Chapman, NP  ASPIRIN LOW DOSE 81 MG EC tablet TK 1 T PO QD 02/28/15   Historical Provider, MD  lisinopril-hydrochlorothiazide (PRINZIDE,ZESTORETIC) 20-12.5 MG tablet TK 1  T PO QD 02/28/15   Historical Provider, MD  metoprolol succinate (TOPROL XL) 50 MG 24 hr tablet Take 1 tablet (50 mg total) by mouth daily. Take with or immediately following a meal. 03/22/15   Micheline Chapman, NP  Naproxen 375 MG TBEC 1 po bid with food prn shoulder pain 06/13/15   Janne Napoleon, NP  omeprazole (PRILOSEC) 20 MG capsule TK 1 C PO QD BEFORE A MEAL 02/28/15   Historical Provider, MD   BP 160/101 mmHg  Pulse 91  Temp(Src) 98.7 F (37.1 C) (Oral)  Resp 18  Wt 150 lb (68.04 kg)  SpO2 98% Physical Exam  Constitutional: He is oriented to person, place, and time. He appears well-developed and well-nourished.  HENT:  Head: Normocephalic and atraumatic.  Cardiovascular: Normal rate.   Pulmonary/Chest: Effort normal.  Musculoskeletal:  Left shoulder: point tenderness to glenohumeral joint region. Decreased shoulder abduction and shoulder raise secondary to pain. No gross deformity noted. Normal left elbow and left wrist ROM. Sensation intact with brisk cap refill. Radial pulses 2+ Cervical spine non-tender  Neurological: He is alert and oriented to person, place, and time.  Skin: Skin is warm and dry.  Psychiatric: He has a normal mood and affect.  Nursing note and vitals reviewed.   ED Course  Procedures (including critical care time) DIAGNOSTIC STUDIES: Oxygen Saturation is 98% on RA, normal by my interpretation.    COORDINATION OF CARE: 1:06 PM- Pt advised of plan for treatment and pt agrees.  Pt informed of his x-ray results. Explained that pt may have a rotator cuff tear. He will receive a sling and pain medication for comfort. Will provide resources for Ortho follow up.   Imaging Review Dg Shoulder Left  07/12/2015  CLINICAL DATA:  Left shoulder pain after injury work several months ago. Initial encounter. EXAM: LEFT SHOULDER - 2+ VIEW COMPARISON:  None. FINDINGS: There is no evidence of fracture or dislocation. There is no evidence of arthropathy or other focal bone  abnormality. Soft tissues are unremarkable. IMPRESSION: Normal left shoulder. Electronically Signed   By: Marijo Conception, M.D.   On: 07/12/2015 13:01     Domenic Moras, PA-C has personally reviewed and evaluated these images as part of his medical decision-making.  MDM   Final diagnoses:  Shoulder sprain, left, initial encounter    BP 158/91 mmHg  Pulse 90  Temp(Src) 98.6 F (37 C) (Oral)  Resp 18  Wt 68.04 kg  SpO2 100%   I personally performed the services described in this documentation, which was scribed in my presence. The recorded information has been reviewed and is accurate.     Domenic Moras, PA-C 07/12/15 1334  Blanchie Dessert, MD 07/12/15 2225

## 2015-07-12 NOTE — ED Notes (Signed)
Triage completed with assistance of pacific interpreters-- ARABIC is primary language--- pt states has pain in shoulder joint-- lifted something heavy 2 months ago also pain in wrist--- intermittent pain, taking otc meds for pain. Unable to lift arm.

## 2015-07-17 DIAGNOSIS — H5713 Ocular pain, bilateral: Secondary | ICD-10-CM

## 2015-07-17 NOTE — Congregational Nurse Program (Signed)
Congregational Nurse Program Note  Date of Encounter: 06/26/2015  Past Medical History: Past Medical History  Diagnosis Date  . Hypertension     Encounter Details:     CNP Questionnaire - 07/17/15 1516    Patient Demographics   Is this a new or existing patient? Existing   Patient is considered a/an Refugee   Race African   Patient Assistance   Location of Patient Assistance Not Applicable   Patient's financial/insurance status Medicaid   Uninsured Patient No   Patient referred to apply for the following financial assistance Not Applicable   Food insecurities addressed Not Applicable   Transportation assistance No   Assistance securing medications No   Educational health offerings Navigating the healthcare system   Encounter Details   Primary purpose of visit [place];Post ED/Hospitalization Visit   Was an Emergency Department visit averted? Not Applicable   Does patient have a medical provider? Yes   Patient referred to Other (comment)   Was a mental health screening completed? (GAINS tool) No   Does patient have dental issues? No   Does patient have vision issues? Yes   Was a vision referral made? Yes   Does your patient have an abnormal blood pressure today? No   Since previous encounter, have you referred patient for abnormal blood pressure that resulted in a new diagnosis or medication change? No   Does your patient have an abnormal blood glucose today? No   Since previous encounter, have you referred patient for abnormal blood glucose that resulted in a new diagnosis or medication change? No   Was there a life-saving intervention made? No      Office visit at [employer] for this Arabic speaking man requesting follow-up assistance after treatment for left arm pain at [facility]. Requested appointment to see physician for bilateral eye pain and new eyeglass prescription.  Taking Tramadol 50mg /q 6 hrs as prescribed, but continues to  experience moderate pain.Facial expression tense compared to usual encounter. Discharge orders recommended sling on left arm, but not observed today. Blood pressure stable.  Requested assistance scheduling follow-up  appointments. Appointment with Dr.Landau/Ortho June 1 at 9 am. Referred to [facility] clinic. Jannetta Quint, RN/CN.

## 2015-07-24 DIAGNOSIS — M25512 Pain in left shoulder: Secondary | ICD-10-CM

## 2015-08-01 NOTE — Congregational Nurse Program (Signed)
Congregational Nurse Program Note  Date of Encounter: 07/24/2015  Past Medical History: Past Medical History  Diagnosis Date  . Hypertension     Encounter Details:     CNP Questionnaire - 07/24/15 0911    Patient Demographics   Is this a new or existing patient? Existing   Patient is considered a/an Refugee   Race African   Patient Assistance   Location of Patient Assistance Not Applicable   Patient's financial/insurance status Medicaid   Uninsured Patient No   Patient referred to apply for the following financial assistance Not Applicable   Food insecurities addressed Not Applicable   Transportation assistance No   Assistance securing medications No   Educational health offerings Navigating the healthcare system   Encounter Details   Primary purpose of visit [place];Post ED/Hospitalization Visit   Was an Emergency Department visit averted? Not Applicable   Does patient have a medical provider? Yes   Patient referred to Other (comment)   Was a mental health screening completed? (GAINS tool) No   Does patient have dental issues? No   Does patient have vision issues? Yes   Does your patient have an abnormal blood pressure today? No   Since previous encounter, have you referred patient for abnormal blood pressure that resulted in a new diagnosis or medication change? No   Does your patient have an abnormal blood glucose today? No   Since previous encounter, have you referred patient for abnormal blood glucose that resulted in a new diagnosis or medication change? No   Was there a life-saving intervention made? No     Follow-up visit to confiirm Orthopaedic referral for left shoulder pain. PCP at [facility] contacted regarding approval for referral to Dr. Mardelle Matte. Office contacted. Office received information and client to return for information and confirmed appointment. Jannetta Quint, RN/CN

## 2015-08-02 ENCOUNTER — Ambulatory Visit: Payer: Medicaid Other | Admitting: Internal Medicine

## 2015-08-08 ENCOUNTER — Ambulatory Visit (INDEPENDENT_AMBULATORY_CARE_PROVIDER_SITE_OTHER): Payer: Medicaid Other | Admitting: Internal Medicine

## 2015-08-08 ENCOUNTER — Encounter: Payer: Self-pay | Admitting: Internal Medicine

## 2015-08-08 VITALS — BP 150/101 | HR 88 | Temp 98.2°F | Ht 66.0 in | Wt 148.5 lb

## 2015-08-08 DIAGNOSIS — N400 Enlarged prostate without lower urinary tract symptoms: Secondary | ICD-10-CM

## 2015-08-08 DIAGNOSIS — N529 Male erectile dysfunction, unspecified: Secondary | ICD-10-CM | POA: Diagnosis not present

## 2015-08-08 DIAGNOSIS — Z1159 Encounter for screening for other viral diseases: Secondary | ICD-10-CM | POA: Diagnosis not present

## 2015-08-08 DIAGNOSIS — I10 Essential (primary) hypertension: Secondary | ICD-10-CM

## 2015-08-08 DIAGNOSIS — Z0000 Encounter for general adult medical examination without abnormal findings: Secondary | ICD-10-CM | POA: Insufficient documentation

## 2015-08-08 HISTORY — DX: Benign prostatic hyperplasia without lower urinary tract symptoms: N40.0

## 2015-08-08 HISTORY — DX: Male erectile dysfunction, unspecified: N52.9

## 2015-08-08 MED ORDER — LISINOPRIL-HYDROCHLOROTHIAZIDE 20-12.5 MG PO TABS: 2.0000 | ORAL_TABLET | Freq: Every day | ORAL | Status: DC

## 2015-08-08 MED ORDER — SILDENAFIL CITRATE 20 MG PO TABS: 40.0000 mg | ORAL_TABLET | ORAL | Status: DC | PRN

## 2015-08-08 NOTE — Assessment & Plan Note (Signed)
Unclear etiology of ED. No spinal injury. Has good pulses on exam. States he does not have nigh time erection either. Not sure if this is 2/2 to uncontrolled HTN or 2/2 to medications. Labs were mostly all normal at urology office.  Will d/c metoprolol incase that's causing decreased libido and impacting his ED. Will do trial of revatio. [facility]ave [facility] for [facility].  F/up prn.

## 2015-08-08 NOTE — Assessment & Plan Note (Signed)
Filed Vitals:   08/08/15 1040  BP: 150/101  Pulse: 88  Temp: 98.2 F (36.8 C)   BP is elevated on current regimen: amlodipine 10mg , lisinopril 20-12.5mg , metoprolol 50mg  24hr tablet  Will increase lisinopril-hctz to 2 tablets (40-25mg  daily). Cont amlodipine 10 mg. D/c metoprolol as his decreased libido and decreased energy could be from metop.  Check BMET F/up 3 months.

## 2015-08-08 NOTE — Progress Notes (Signed)
   Subjective:    Patient ID: Thomas Bailey, male    DOB: [birth year], 60 y.o.   MRN: [id number]  HPI  60 yo male with hx of HTN, ED, GERD, here to establish care as a new patient. Used arabic interpretor to interview the patient.    HTN: on amlodipine 10mg , lisinopril 20-12.5mg , metoprolol 50mg  24hr tablet  Erectile dysfunction: having problem obtaining erection for 3 years, used viagara 3 years ago without any benefit. Also early ejaculation. Has decreased libido.  Denies night time erection. Remains interested in his partner. Has been with the same male partner (his wife) for 20 years. Has 2 kids. Did not have any problem with erection in the past before 3 years ago. Feels that when he tried viaagra 3 years ago that worsened the erection problem.   Saw Urology Dr. Gaynelle Arabian for ED, workup including UA (2+ protein), 2+ blood), had CT hematuria protocol which showed no mass, stones, or hydronephrosis. Prostate is enlarged. Had incidental 5 mm RLL pulm nodule (no follow up is needed as he is low risk). [place] mildly elevated 9.3, LH, prolactin, testosterone, estradiol, PSA, TSH, and T4 were all normal. Did not have any further recommendation.  No back injury. Started metoprolol 6 months ago, did not notice any change in ED since starting it.   Denies any hx of diabetes, heart disease,cancer, hx of HIV or Hepatitis. States he was told by a doctor in [place] that he has "problem with heart vessel, needs to have stent or CABG" but was told by a doctor in [place] that he just HTN and not CAD.   FH: HTN in father, mother, brother. No hx of cancer or heart disease.  SHx: smoked in the past. No alcohol or drug use.     Review of Systems  Constitutional: Positive for fatigue. Negative for fever and chills.  HENT: Negative for congestion and sore throat.   Eyes: Negative for photophobia and visual disturbance.  Respiratory: Negative for chest tightness and shortness of breath.     Cardiovascular: Negative for chest pain, palpitations and leg swelling.  Gastrointestinal: Negative for abdominal pain and abdominal distention.  Genitourinary: Negative for dysuria, flank pain, penile swelling and testicular pain.  Musculoskeletal: Negative for back pain, joint swelling, arthralgias and gait problem.  Skin: Negative for rash.  Neurological: Negative for dizziness and numbness.       Objective:   Physical Exam  Constitutional: He is oriented to person, place, and time. He appears well-developed and well-nourished. No distress.  Pleasant male.   Eyes: Conjunctivae are normal. Right eye exhibits no discharge. Left eye exhibits no discharge. No scleral icterus.  Neck: Normal range of motion.  Cardiovascular: Normal rate and regular rhythm.  Exam reveals no gallop and no friction rub.   No murmur heard. Pulmonary/Chest: Effort normal and breath sounds normal. No respiratory distress. He has no wheezes.  Abdominal: Soft. Bowel sounds are normal.  Musculoskeletal: Normal range of motion. He exhibits no edema or tenderness.  Normal distal pulses on lower exts.   Neurological: He is alert and oriented to person, place, and time.  Skin: He is not diaphoretic.  Psychiatric: He has a normal mood and affect.     Filed Vitals:   08/08/15 1040  BP: 150/101  Pulse: 88  Temp: 98.2 F (36.8 C)      Assessment & Plan:  See problem based a&p.

## 2015-08-08 NOTE — Assessment & Plan Note (Addendum)
Needs Hep C screening, ordered.   Hep C neg

## 2015-08-08 NOTE — Assessment & Plan Note (Addendum)
Will screen for Hep B as he is from [place] which has high prevalence for hep B.  Hep B immune.

## 2015-08-08 NOTE — Assessment & Plan Note (Addendum)
Will check for HIV, Hep C, and Hep B as he is from [place] which has high prevalence for hep B.  HIV, Hep C negative. Hep B immune. Crt stable around baseline.

## 2015-08-08 NOTE — Patient Instructions (Signed)
Take revatio for sex as needed.  Take lisinopril-hctz 2 tablets daily.  Stop taking metoprolol  Follow up in 3 months.

## 2015-08-09 LAB — BASIC METABOLIC PANEL
BUN/Creatinine Ratio: 18 (ref 9–20)
BUN: 25 mg/dL — ABNORMAL HIGH (ref 6–24)
CALCIUM: 9.7 mg/dL (ref 8.7–10.2)
CO2: 23 mmol/L (ref 18–29)
CREATININE: 1.36 mg/dL — AB (ref 0.76–1.27)
Chloride: 100 mmol/L (ref 96–106)
GFR, EST AFRICAN AMERICAN: 66 mL/min/{1.73_m2} (ref >59)
GFR, EST NON AFRICAN AMERICAN: 57 mL/min/{1.73_m2} — AB (ref >59)
Glucose: 112 mg/dL — ABNORMAL HIGH (ref 65–99)
Potassium: 4.8 mmol/L (ref 3.5–5.2)
Sodium: 140 mmol/L (ref 134–144)

## 2015-08-09 LAB — HIV ANTIBODY (ROUTINE TESTING W REFLEX): HIV SCREEN 4TH GENERATION: NONREACTIVE

## 2015-08-09 LAB — HEPATITIS C ANTIBODY

## 2015-08-09 LAB — HEPATITIS B CORE ANTIBODY, TOTAL: Hep B Core Total Ab: NEGATIVE

## 2015-08-09 LAB — HEPATITIS B SURFACE ANTIGEN: Hepatitis B Surface Ag: NEGATIVE

## 2015-08-09 LAB — HEPATITIS B SURFACE ANTIBODY,QUALITATIVE: HEP B SURFACE AB, QUAL: REACTIVE

## 2015-08-09 NOTE — Progress Notes (Signed)
Internal Medicine Clinic Attending  Case discussed with Dr. Ahmed at the time of the visit.  We reviewed the resident's history and exam and pertinent patient test results.  I agree with the assessment, diagnosis, and plan of care documented in the resident's note. 

## 2015-08-14 DIAGNOSIS — H5713 Ocular pain, bilateral: Secondary | ICD-10-CM

## 2015-08-14 NOTE — Congregational Nurse Program (Signed)
Congregational Nurse Program Note  Date of Encounter: 07/17/2015  Past Medical History: Past Medical History  Diagnosis Date  . Hypertension     Encounter Details:     CNP Questionnaire - 07/24/15 0911    Patient Demographics   Is this a new or existing patient? Existing   Patient is considered a/an Refugee   Race African   Patient Assistance   Location of Patient Assistance Not Applicable   Patient's financial/insurance status Medicaid   Uninsured Patient No   Patient referred to apply for the following financial assistance Not Applicable   Food insecurities addressed Not Applicable   Transportation assistance No   Assistance securing medications No   Educational health offerings Navigating the healthcare system   Encounter Details   Primary purpose of visit [place];Post ED/Hospitalization Visit   Was an Emergency Department visit averted? Not Applicable   Does patient have a medical provider? Yes   Patient referred to Other (comment)   Was a mental health screening completed? (GAINS tool) No   Does patient have dental issues? No   Does patient have vision issues? Yes   Does your patient have an abnormal blood pressure today? No   Since previous encounter, have you referred patient for abnormal blood pressure that resulted in a new diagnosis or medication change? No   Does your patient have an abnormal blood glucose today? No   Since previous encounter, have you referred patient for abnormal blood glucose that resulted in a new diagnosis or medication change? No   Was there a life-saving intervention made? No     Office visit to confirm physician referrals to Dr. Mardelle Matte for orthopaedic evaluation of left shoulder. Telephone request to be made to [facility]. Jannetta Quint, RN/CN

## 2015-08-15 NOTE — Congregational Nurse Program (Signed)
Congregational Nurse Program Note  Date of Encounter: 08/14/2015  Past Medical History: Past Medical History  Diagnosis Date  . Hypertension     Encounter Details:     CNP Questionnaire - 08/14/15 0930    Patient Demographics   Is this a new or existing patient? Existing   Patient is considered a/an Refugee   Race African   Patient Assistance   Location of Patient Assistance Not Applicable   Patient's financial/insurance status Medicaid;Low Income   Uninsured Patient No   Patient referred to apply for the following financial assistance Medicaid   Food insecurities addressed Not Applicable   Transportation assistance No   Assistance securing medications No   Educational health offerings Health literacy;Other   Encounter Details   Primary purpose of visit Acute Illness/Condition Visit   Was an Emergency Department visit averted? Not Applicable   Does patient have a medical provider? Yes   Patient referred to Other (comment)   Was a mental health screening completed? (GAINS tool) No   Does patient have dental issues? No   Does patient have vision issues? Yes   Was a vision referral made? Yes   Does your patient have an abnormal blood pressure today? No   Since previous encounter, have you referred patient for abnormal blood pressure that resulted in a new diagnosis or medication change? Yes   Does your patient have an abnormal blood glucose today? No   Since previous encounter, have you referred patient for abnormal blood glucose that resulted in a new diagnosis or medication change? No   Was there a life-saving intervention made? No         Amb Nursing Assessment - 08/08/15 1048    Pre-visit preparation   Pre-visit preparation completed Yes   Pain Assessment   Pain Assessment 0-10   Pain Score 6    Pain Type Acute pain   Pain Location Shoulder   Pain Orientation Left   Pain Descriptors / Indicators Constant;Aching   Pain Onset More than a month ago   Pain  Frequency Constant   Effect of Pain on Daily Activities Limited ROM.   Nutrition Screen   BMI - recorded 23.97   Nutritional Status BMI of 19-24  Normal   Nutritional Risks None   Diabetes No   Functional Status   Activities of Daily Living Independent   Ambulation Independent   Medication Administration Independent   Home Management Independent   Risk/Barriers  Assessment   Barriers to Care Management & Learning Language  Arabic.   Abuse/Neglect Assessment   Do you feel unsafe in your current relationship? No   Do you feel physically threatened by others? No   Anyone hurting you at home, work, or school? No   Unable to ask? No   Patient Literacy   How often do you need to have someone help you when you read instructions, pamphlets, or other written materials from your doctor or pharmacy? 2 - Rarely  Language.   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 Yes   Geographical information systems officer.   Interpreter Name Priscille Loveless   Interpreter ID [phone]   Patient Declined Interpreter  No   Comments   Information entered by : Lanell Persons 08/08/15@ 1050AM     Office visit at [facility] [place] office for this Arabic speaking man reporting eye surgery on right eye. Surgery scheduled on 08/15/15 at 10:30 am by  Dr. Zenia Resides office. Conversation completed in [place] with understanding. Requesting assistance placing drops in right eye this am  and information about eye drops prescribed pre surgery. Ofloxacin and Ketorolac daily in right eye before surgery and Duregol daily after surgery. Instructions provided by Dr. Zenia Resides office  Understands surgery schedule, but unsure about condition of eye. Hypertension under control with medications. Currently under care of Orthopaedics for left shoulder pain. Reports some relief, but pain continues on scale of 6-8.  Instructed on using eye drops before and after surgery daily. Hygiene  discussed touching surgical area post surgery.  Medicine placed in right eye at 9:30. Follow-up upon return to [facility] as prn. Jannetta Quint, RN/CN

## 2015-11-19 ENCOUNTER — Ambulatory Visit (INDEPENDENT_AMBULATORY_CARE_PROVIDER_SITE_OTHER): Payer: Medicaid Other | Admitting: Internal Medicine

## 2015-11-19 ENCOUNTER — Encounter: Payer: Self-pay | Admitting: Internal Medicine

## 2015-11-19 VITALS — BP 130/78 | HR 94 | Temp 98.0°F | Ht 66.0 in | Wt 152.1 lb

## 2015-11-19 DIAGNOSIS — N529 Male erectile dysfunction, unspecified: Secondary | ICD-10-CM

## 2015-11-19 DIAGNOSIS — Z79899 Other long term (current) drug therapy: Secondary | ICD-10-CM

## 2015-11-19 DIAGNOSIS — I10 Essential (primary) hypertension: Secondary | ICD-10-CM

## 2015-11-19 DIAGNOSIS — N522 Drug-induced erectile dysfunction: Secondary | ICD-10-CM

## 2015-11-19 NOTE — Assessment & Plan Note (Signed)
ED likely from 2/2 to metoprolol  Will stop metoprolol and see if it improves.  Will cont revatio 40mg  prn sex for now. May not need it if he is off metoprolol.  F/up prn.

## 2015-11-19 NOTE — Progress Notes (Signed)
   CC: follow up for HTN and ED.  HPI:  [name] is a 60 y.o. with the below listed PMH here for follow up of HTN and ED.   Past Medical History:  Diagnosis Date  . Hypertension    HTN: currently on amlodipine 10mg  daily + lisinopril-hctz 40-25mg  daily and metoprolol 25mg  daily.   ED: started revatio 40mg  prn last visit. States it's not helping. Only gets mild erection, but does not feel that it's enough, also gets pre-mature ejaculation. Denies nocturnal erection. Still taking metoprolol even though I told him to stop it last time. He stated his cardiologist Dr. Terrence Dupont recommended it for him. But he has no known hx of CAD or other cardiac problems aside from having HTN.     Review of Systems:    Review of Systems  Constitutional: Negative for chills and fever.  Respiratory: Negative for cough and shortness of breath.   Cardiovascular: Negative for chest pain and palpitations.  Genitourinary: Negative for dysuria.       Erectile dysfunction  Skin: Negative for rash.  Neurological: Negative for headaches.     Physical Exam:  Vitals:   11/19/15 1508  BP: 130/78  Pulse: 94  Temp: 98 F (36.7 C)  TempSrc: Oral  SpO2: 100%  Weight: 152 lb 1.6 oz (69 kg)  Height: 5\' 6"  (1.676 m)   . Physical Exam  Constitutional: He appears well-developed and well-nourished. No distress.  Used interpretor to talk to the patient.   HENT:  Head: Normocephalic and atraumatic.  Eyes: Conjunctivae are normal.  Cardiovascular: Exam reveals no gallop and no friction rub.   No murmur heard. Respiratory: Effort normal and breath sounds normal. No respiratory distress. He has no wheezes.  Musculoskeletal: Normal range of motion. He exhibits no edema.  Neurological: He is alert.  Skin: He is not diaphoretic.  Psychiatric: He has a normal mood and affect.     Assessment & Plan:   See Encounters Tab for problem based charting.  Patient discussed with Dr.  Dareen Piano

## 2015-11-19 NOTE — Patient Instructions (Signed)
Call Dr. Terrence Dupont your cardiologist and ask him about the metoprolol.  I want you to stop taking it if possible. This is most likely causing your erection problems.  After you stop it, your erection should improve in few weeks. Let us know if it's not improving.  Follow up in 3 months.

## 2015-11-19 NOTE — Assessment & Plan Note (Signed)
Vitals:   11/19/15 1508  BP: 130/78  Pulse: 94  Temp: 98 F (36.7 C)   Bp well controlled now on amlodipine 10mg  daily + lisinopril 40-25mg  daily. Also taking metoprolol 50mg  daily.   Will stop metoprolol (only takes it for HTN, not for CAD or any other indication) , will see if that helps with his ED.

## 2015-11-21 DIAGNOSIS — I10 Essential (primary) hypertension: Secondary | ICD-10-CM

## 2015-11-21 NOTE — Congregational Nurse Program (Signed)
Congregational Nurse Program Note  Date of Encounter: 11/21/2015  Past Medical History: Past Medical History:  Diagnosis Date  . BPH (benign prostatic hyperplasia) 08/08/2015  . Erectile dysfunction 08/08/2015  . GERD (gastroesophageal reflux disease)   . Hypertension     Encounter Details:     CNP Questionnaire - 11/21/15 1432      Patient Demographics   Is this a new or existing patient? Existing   Race African     Patient Assistance   Location of Patient Assistance Not Applicable   Patient's financial/insurance status Medicaid;Low Income   Patient referred to apply for the following financial assistance Marketplace or to a Navigator;Not Applicable   Food insecurities addressed Not Applicable   Transportation assistance No   Assistance securing medications No   Educational health offerings Hypertension;Navigating the healthcare system     Encounter Details   Primary purpose of visit Other   Was an Emergency Department visit averted? Not Applicable   Does patient have a medical provider? Yes   Patient referred to Other (comment)  Agency social worker   Was a mental health screening completed? (GAINS tool) No   Does patient have dental issues? No   Does patient have vision issues? No   Does your patient have an abnormal blood pressure today? No   Since previous encounter, have you referred patient for abnormal blood pressure that resulted in a new diagnosis or medication change? No   Does your patient have an abnormal blood glucose today? No   Since previous encounter, have you referred patient for abnormal blood glucose that resulted in a new diagnosis or medication change? No   Was there a life-saving intervention made? No         Amb Nursing Assessment - 11/19/15 1655      Pre-visit preparation   Pre-visit preparation completed Yes     Nutrition Screen   BMI - recorded 24.55   Nutritional Status BMI 25 -29 Overweight   Nutritional Risks None   Diabetes No      Functional Status   Activities of Daily Living Independent   Ambulation Independent   Medication Administration Independent   Home Management Independent     Risk/Barriers  Assessment   Barriers to Care Management & Learning None     Abuse/Neglect Assessment   Do you feel unsafe in your current relationship? No   Do you feel physically threatened by others? No   Anyone hurting you at home, work, or school? No   Unable to ask? No     Investment banker, operational Needed? Yes   Associate Professor mobile   Interpreter Name [place]   Patient Declined Interpreter  No   Patient signed [facility] waiver No     Office visit to request blood pressure check. Concerns about Medicaid coverage of recent Anesthesia bill received after eye surgery. Medicaid active based on information provided. BLood pressure managed with current medications and compliant. Refer to social worker to review Medicaid payment. Return prn. Jannetta Quint, RN/CN

## 2015-11-22 NOTE — Progress Notes (Signed)
Internal Medicine Clinic Attending  Case discussed with Dr. Ahmed at the time of the visit.  We reviewed the resident's history and exam and pertinent patient test results.  I agree with the assessment, diagnosis, and plan of care documented in the resident's note. 

## 2015-12-11 NOTE — Congregational Nurse Program (Signed)
Congregational Nurse Program Note  Date of Encounter: 11/21/2015  Past Medical History: Past Medical History:  Diagnosis Date  . BPH (benign prostatic hyperplasia) 08/08/2015  . Erectile dysfunction 08/08/2015  . GERD (gastroesophageal reflux disease)   . Hypertension     Encounter Details:     CNP Questionnaire - 12/11/15 1100      Patient Demographics   Is this a new or existing patient? Existing   Patient is considered a/an Refugee   Race African     Patient Assistance   Location of Patient Assistance Not Applicable   Patient's financial/insurance status Medicaid;Low Income   Uninsured Patient No   Patient referred to apply for the following financial assistance Marketplace or to a Navigator;Not Applicable   Food insecurities addressed Not Applicable   Transportation assistance No   Assistance securing medications No   Educational health offerings Hypertension;Navigating the healthcare system     Encounter Details   Primary purpose of visit Other   Was an Emergency Department visit averted? Not Applicable   Does patient have a medical provider? Yes   Patient referred to Other (comment)  Agency social worker   Was a mental health screening completed? (GAINS tool) No   Does patient have dental issues? No   Does patient have vision issues? No   Does your patient have an abnormal blood pressure today? No   Since previous encounter, have you referred patient for abnormal blood pressure that resulted in a new diagnosis or medication change? No   Does your patient have an abnormal blood glucose today? No   Since previous encounter, have you referred patient for abnormal blood glucose that resulted in a new diagnosis or medication change? No   Was there a life-saving intervention made? No         Amb Nursing Assessment - 11/19/15 1655      Pre-visit preparation   Pre-visit preparation completed Yes     Nutrition Screen   BMI - recorded 24.55   Nutritional Status BMI  25 -29 Overweight   Nutritional Risks None   Diabetes No     Functional Status   Activities of Daily Living Independent   Ambulation Independent   Medication Administration Independent   Home Management Independent     Risk/Barriers  Assessment   Barriers to Care Management & Learning None     Abuse/Neglect Assessment   Do you feel unsafe in your current relationship? No   Do you feel physically threatened by others? No   Anyone hurting you at home, work, or school? No   Unable to ask? No     Investment banker, operational Needed? Yes   Nurse, adult Name [place]   Patient Declined Interpreter  No   Patient signed [place] waiver No     Office visit at [facility] requesting blood pressure monitoring. Blood pressure stable. Currently on medications for hypertension and admits to taking meds as prescribed by provider. Concerned about Medicaid coverage for self and family. Requested assistance calling to verify coverage. Medicaid card unavailable today, but will show it on tomorrow. Medicaid confirmed for spouse and son.  Jannetta Quint, RN/CN.

## 2016-07-18 ENCOUNTER — Telehealth: Payer: Self-pay | Admitting: Internal Medicine

## 2016-07-18 NOTE — Telephone Encounter (Signed)
APT. REMINDER CALL, NO ANSWER, NO VOICEMAIL °

## 2016-07-21 ENCOUNTER — Encounter: Payer: Medicaid Other | Admitting: Internal Medicine

## 2016-07-21 ENCOUNTER — Encounter: Payer: Self-pay | Admitting: Internal Medicine

## 2016-08-07 ENCOUNTER — Encounter: Payer: Self-pay | Admitting: *Deleted

## 2017-02-03 ENCOUNTER — Other Ambulatory Visit: Payer: Self-pay

## 2017-02-03 ENCOUNTER — Encounter (INDEPENDENT_AMBULATORY_CARE_PROVIDER_SITE_OTHER): Payer: Self-pay

## 2017-02-03 ENCOUNTER — Ambulatory Visit: Payer: BLUE CROSS/BLUE SHIELD | Admitting: Internal Medicine

## 2017-02-03 ENCOUNTER — Encounter: Payer: Self-pay | Admitting: Internal Medicine

## 2017-02-03 VITALS — BP 98/79 | HR 76 | Temp 98.7°F | Ht 66.0 in | Wt 142.0 lb

## 2017-02-03 DIAGNOSIS — N522 Drug-induced erectile dysfunction: Secondary | ICD-10-CM

## 2017-02-03 DIAGNOSIS — K219 Gastro-esophageal reflux disease without esophagitis: Secondary | ICD-10-CM | POA: Diagnosis not present

## 2017-02-03 DIAGNOSIS — N529 Male erectile dysfunction, unspecified: Secondary | ICD-10-CM

## 2017-02-03 DIAGNOSIS — Z79899 Other long term (current) drug therapy: Secondary | ICD-10-CM

## 2017-02-03 DIAGNOSIS — I10 Essential (primary) hypertension: Secondary | ICD-10-CM | POA: Diagnosis not present

## 2017-02-03 MED ORDER — OMEPRAZOLE 20 MG PO CPDR: DELAYED_RELEASE_CAPSULE | ORAL | 5 refills | Status: DC

## 2017-02-03 MED ORDER — NAPROXEN 375 MG PO TBEC: DELAYED_RELEASE_TABLET | ORAL | 0 refills | Status: DC

## 2017-02-03 NOTE — Assessment & Plan Note (Signed)
Patients BP <100/90 today. Patient reports symptoms of intermittent dizziness and headaches, that worsen with use of sildenafil, and are consistent with symptomatic hypotension. Given low BP in clinic today, intermittent hypotension, and significant side effect of ED with anti-hypertensives, plan to discontinue use of combination lisinopril-HCTZ at this time. Will have patient return to clinic in 6 weeks for evaluation and possible reinitiation of low-dose BP medication. Also check BMP today to monitor kidney function, since this was elevated 1 year ago. Will consider low dose amlodipine at follow up visit for BP management if needed and if kidney function worsening.  Plan: -Discontinue lisinopril-HCTZ 40-25 mg daily -RTC in 6 weeks for BP follow up -BMP today to assess kidney  function

## 2017-02-03 NOTE — Assessment & Plan Note (Addendum)
Patient currently failed oral medications and has symptoms of severe headaches when using these medications. Patient's ED seems to be a result of anti-hypertensive therapies, as patient does not report this problem before starting on BP medication. Patient does state, however, that he continued having ED while he was taking off BP medications by his PCP while living in [place]. Since he has failed medications patient has inquired today about an implantable device that could assist with ED rather than trying another medication. This seems reasonable and will refer to urology for evaluation and discussion regarding device.   Plan: -Urology referal for device discussion -Discontinue use of sildenafil 2/2 symptomatic hypotension with use -Titer BP medications appropriately to minimize effects of anti-HTN medications on this problem

## 2017-02-03 NOTE — Assessment & Plan Note (Signed)
Patient's epigastric pain after eating that self-resolves after 1-2 hours of sitting up straight is consistent with GERD. The patient states his symptoms were similar to those that caused him to start taking omeprazole last year. He stopped taking this medication because his stomach pain went away and he thought he no longer needed it. Since his symptoms have returned, he was instructed to continue taking omeprazole 20 mg daily. He was instructed to take this mediation before eating to prevent his symptoms and that he should take this medication daily until follow up reassessment.   Plan: -Omeprazole 20 mg daily before meal -RTC in 6 weeks

## 2017-02-03 NOTE — Addendum Note (Signed)
Addended by: Thomasene Ripple A on: 02/03/2017 05:54 PM   Modules accepted: Orders

## 2017-02-03 NOTE — Progress Notes (Signed)
   CC: Hypertension and ED follow up  HPI:  [name] is a 61 y.o. with PMH of hypertension, GERD, and ED who presents for evaluation of chronic medical conditions. He was last seen on 11/19/2015 where he was prescribed lisinopril-HCTZ (40-25 mg) for HTN in addition to amlodipine 10 mg. Patient also given sildenafil for ED, which was reported to be a result of anti-hypertensive medications. Since that visit the patient has taken only his lisinopril-HCTZ for BP management. He endorses intermittent dizziness while standing but states this is not bothersome or frequent. He denies chest pain, headaches, focal weakness, and changes in vision.  He states that his erections have not improved with use of sildenafil. The patient reports multiple episodes of worsening headache while taking this medication, to the point that he can no longer use this medication to help achieve erection. The patient inquires today about an implantable device that he can use since the medication doesn't work and causes such bad headaches.  Patient also notes epigastric discomfort after eating. It usually lasts for 1-2 hours after having large meals and self resolves, however the pain is quite intolerable when it comes on. The patient recently stopped taking his omeprazole because his stomach no longer hurt him like in the past, but since these new symptoms of stomach pain started he re-initiated this medication about 5-7 days ago. This medication has helped with his pain a little bit but it is not yet fully resolved.   Past Medical History: Past Medical History:  Diagnosis Date  . BPH (benign prostatic hyperplasia) 08/08/2015  . Erectile dysfunction 08/08/2015  . GERD (gastroesophageal reflux disease)   . Hypertension    Review of Systems:   Patient endorses intermittent headaches and dizziness, as per HPI Patient denies shortness of breath, abdominal pain, diaphoresis, nausea/vomiting, lower extremity  swelling, and change in bowel/bladder habits.  Physical Exam:  Vitals:   02/03/17 1423  BP: 98/79  Pulse: 76  Temp: 98.7 F (37.1 C)  TempSrc: Oral  SpO2: 100%  Weight: 142 lb (64.4 kg)  Height: 5\' 6"  (1.676 m)   Physical Exam  Constitutional: He appears well-developed and well-nourished. No distress.  HENT:  Mouth/Throat: Oropharynx is clear and moist. No oropharyngeal exudate.  Cardiovascular: Normal rate, regular rhythm and intact distal pulses. Exam reveals no friction rub.  No murmur heard. Respiratory: Effort normal and breath sounds normal. No respiratory distress.  GI: Soft. Bowel sounds are normal. He exhibits no distension. There is no tenderness. There is no rebound.  Musculoskeletal: He exhibits no edema (of bilateral lower extremities) or tenderness (of bilateral lower extremities).  Lymphadenopathy:    He has no cervical adenopathy.  Skin: Skin is warm and dry. No rash noted. He is not diaphoretic. No erythema.    Assessment & Plan:   See Encounters Tab for problem based charting.  Patient seen with Dr. Lynnae January

## 2017-02-03 NOTE — Patient Instructions (Addendum)
FOLLOW-UP INSTRUCTIONS When: March 16, 2017 For: BP and ED follow up  Thank you for seeing [place] in the clinic today!  You were evaluated for high blood pressure. Your blood pressure was low in clinic today. Please stop taking lisinopril-hctz daily. Stopping this medication will help with your symptoms of dizziness and headache. You were also referred to a urologist for evaluation. Please follow up with them as you are able.  Please return to the clinic in 6 weeks for follow up of your blood pressure.   If you have any questions or concerns, please call our clinic at [phone] between 9am-5pm and after hours call [phone] and ask for the internal medicine resident on call. If you feel you are having a medical emergency please call 911.   Dr. Thomasene Ripple

## 2017-02-04 LAB — BMP8+ANION GAP
Anion Gap: 15 mmol/L (ref 10.0–18.0)
BUN/Creatinine Ratio: 14 (ref 9–20)
BUN: 22 mg/dL (ref 6–24)
CO2: 27 mmol/L (ref 20–29)
Calcium: 10 mg/dL (ref 8.7–10.2)
Chloride: 100 mmol/L (ref 96–106)
Creatinine, Ser: 1.59 mg/dL — ABNORMAL HIGH (ref 0.76–1.27)
GFR calc Af Amer: 54 mL/min/{1.73_m2} — ABNORMAL LOW (ref >59)
GFR calc non Af Amer: 47 mL/min/{1.73_m2} — ABNORMAL LOW (ref >59)
Glucose: 89 mg/dL (ref 65–99)
Potassium: 4.1 mmol/L (ref 3.5–5.2)
Sodium: 142 mmol/L (ref 134–144)

## 2017-02-04 NOTE — Progress Notes (Signed)
Internal Medicine Clinic Attending  I saw and evaluated the patient.  I personally confirmed the key portions of the history and exam documented by Dr. Nedrud and I reviewed pertinent patient test results.  The assessment, diagnosis, and plan were formulated together and I agree with the documentation in the resident's note.  

## 2017-03-16 ENCOUNTER — Encounter: Payer: Self-pay | Admitting: Internal Medicine

## 2017-03-16 ENCOUNTER — Ambulatory Visit (INDEPENDENT_AMBULATORY_CARE_PROVIDER_SITE_OTHER): Payer: BLUE CROSS/BLUE SHIELD | Admitting: Internal Medicine

## 2017-03-16 DIAGNOSIS — I10 Essential (primary) hypertension: Secondary | ICD-10-CM | POA: Diagnosis not present

## 2017-03-16 DIAGNOSIS — M79641 Pain in right hand: Secondary | ICD-10-CM | POA: Diagnosis not present

## 2017-03-16 DIAGNOSIS — M25512 Pain in left shoulder: Secondary | ICD-10-CM

## 2017-03-16 DIAGNOSIS — M25531 Pain in right wrist: Secondary | ICD-10-CM | POA: Diagnosis not present

## 2017-03-16 DIAGNOSIS — M79642 Pain in left hand: Secondary | ICD-10-CM

## 2017-03-16 DIAGNOSIS — M25532 Pain in left wrist: Secondary | ICD-10-CM

## 2017-03-16 DIAGNOSIS — M25511 Pain in right shoulder: Secondary | ICD-10-CM

## 2017-03-16 DIAGNOSIS — Z87891 Personal history of nicotine dependence: Secondary | ICD-10-CM

## 2017-03-16 DIAGNOSIS — K219 Gastro-esophageal reflux disease without esophagitis: Secondary | ICD-10-CM

## 2017-03-16 DIAGNOSIS — N529 Male erectile dysfunction, unspecified: Secondary | ICD-10-CM | POA: Diagnosis not present

## 2017-03-16 MED ORDER — AMLODIPINE BESYLATE 5 MG PO TABS: 5.0000 mg | ORAL_TABLET | Freq: Every day | ORAL | 1 refills | Status: DC

## 2017-03-16 MED ORDER — AMLODIPINE BESYLATE 5 MG PO TABS: 5.0000 mg | ORAL_TABLET | Freq: Every day | ORAL | 2 refills | Status: DC

## 2017-03-16 NOTE — Progress Notes (Signed)
   CC: HTN follow up  HPI:  [name] is a 62 y.o. with PMH of HTN and GERD who is presenting for follow up of HTN. At his last visit 1 month ago the patient was taking lisinopril-HCTZ 40-25 mg and his SBP <100. He also exhibits symptoms of hypotension during that visit. Patient has not taken BP medication since last visit. Denies headaches, chest pain, changes in vision, leg swelling, and nausea/vomiting. States that he has intermittent shoulder pain, which is associated with lifting heavy boxes, that is the same that he experienced prior to last visit. This pain is much improved with PRN use of naproxen. He endorse intermittent pain in his hands that occurs whenever he lifts heavy objects or packs too many cardboard boxes. Endorses some intermittent pain in his wrists. No swelling. States that the pain is worse in the morning and continues throughout the day.  Past Medical History: Past Medical History:  Diagnosis Date  . BPH (benign prostatic hyperplasia) 08/08/2015  . Erectile dysfunction 08/08/2015  . GERD (gastroesophageal reflux disease)   . Hypertension    Review of Systems:   Patient endorses intermittent shoulder and hand pain with increasing activity, as per HPI Patient denies chest pain, shortness of breath, abdominal pain, diaphoresis, nausea/vomiting, lower extremity swelling, and change in bowel/bladder habits.  Physical Exam:  Vitals:   03/16/17 1556  BP: (!) 166/97  Pulse: 73  Temp: 98 F (36.7 C)  TempSrc: Oral  SpO2: 100%  Weight: 141 lb 6.4 oz (64.1 kg)  Height: 5\' 6"  (1.676 m)   Physical Exam  Constitutional: He appears well-developed and well-nourished. No distress.  Cardiovascular: Normal rate, regular rhythm and intact distal pulses. Exam reveals no friction rub.  No murmur heard. Respiratory: Effort normal. No respiratory distress. He has no wheezes.  No crackles appreciated  GI: Soft. He exhibits no distension. There is no  tenderness. There is no rebound.  Musculoskeletal: He exhibits no edema (of bilateral lower extremities) or tenderness (of bilateral lower extremities).  Skin: Skin is warm and dry. No rash noted. No erythema.   Assessment & Plan:   See Encounters Tab for problem based charting.  Patient discussed with Dr. Dareen Piano.

## 2017-03-16 NOTE — Assessment & Plan Note (Addendum)
Patient's BP elevated 166/97 today, above goal of 140/90. This is not surprising since his BP medication was discontinued after last visit. BMP from last visit showed Cr of 1.6, eGFR around 50 (previous baseline 1.4, with eGFR around 50). Patient has not had chest pain, SOB, leg swelling, or headaches. Will slowly add back medications for HTN so that we achieve good control without exacerbating ED or orthostatic hypotension.   Plan: -Start amlodipine 5 mg -RTC on 05/11/17 at 3:45 for BP follow up

## 2017-03-16 NOTE — Assessment & Plan Note (Signed)
Pt didn't understand that he was to call [facility] when they left him a message. Facilitated scheduling appointment with [facility]. He has one scheduled for 04/13/2016 at 3:30 pm with Dr. Lovena Neighbours. They are aware he speaks arabic and he is aware that he is to show up 30 minutes early so he does not miss appointment. Patient looks forward to being evaluated for device placement after continued symptoms on ED medication for multiple years.

## 2017-03-16 NOTE — Patient Instructions (Addendum)
Thank you for seeing [place] in the clinic today!  You were evaluated for high blood pressure. Please start taking amlodipine 5 mg every day.   Please follow up at [facility] with Dr. Lovena Neighbours on Monday April 13, 2017. Your appointment is at 3:30 pm. Please arrive 30 minutes early to that appointment so that you do not miss it.  [facility]: [place], [place] [address]   Please return to the clinic on 05/18/2017 for follow up of your high blood pressure.   If you have any questions or concerns, please call our clinic at [phone] between the hours of 9am-5pm. If you have a problem after these hours, please call [phone] and ask for the internal medicine resident on call. If you feel you are having a medical emergency please call 911.   Thanks, Dr. Larena Glassman Thomas Bailey

## 2017-03-17 NOTE — Progress Notes (Signed)
Internal Medicine Clinic Attending  Case discussed with Dr. Nedrud at the time of the visit.  We reviewed the resident's history and exam and pertinent patient test results.  I agree with the assessment, diagnosis, and plan of care documented in the resident's note.  

## 2017-03-27 ENCOUNTER — Other Ambulatory Visit: Payer: Self-pay | Admitting: *Deleted

## 2017-03-27 MED ORDER — NAPROXEN 375 MG PO TBEC: DELAYED_RELEASE_TABLET | ORAL | 3 refills | Status: AC

## 2017-03-27 NOTE — Telephone Encounter (Signed)
Next appt scheduled  05/11/17 with PCP.

## 2017-05-11 ENCOUNTER — Encounter: Payer: Self-pay | Admitting: Internal Medicine

## 2017-05-11 ENCOUNTER — Ambulatory Visit: Payer: BLUE CROSS/BLUE SHIELD | Admitting: Internal Medicine

## 2017-05-11 ENCOUNTER — Other Ambulatory Visit: Payer: Self-pay

## 2017-05-11 VITALS — BP 154/96 | HR 79 | Temp 98.2°F | Ht 66.0 in | Wt 144.2 lb

## 2017-05-11 DIAGNOSIS — N183 Chronic kidney disease, stage 3 unspecified: Secondary | ICD-10-CM | POA: Insufficient documentation

## 2017-05-11 DIAGNOSIS — M25512 Pain in left shoulder: Secondary | ICD-10-CM

## 2017-05-11 DIAGNOSIS — I129 Hypertensive chronic kidney disease with stage 1 through stage 4 chronic kidney disease, or unspecified chronic kidney disease: Secondary | ICD-10-CM

## 2017-05-11 DIAGNOSIS — M25561 Pain in right knee: Secondary | ICD-10-CM

## 2017-05-11 DIAGNOSIS — M25531 Pain in right wrist: Secondary | ICD-10-CM

## 2017-05-11 DIAGNOSIS — M25642 Stiffness of left hand, not elsewhere classified: Secondary | ICD-10-CM

## 2017-05-11 DIAGNOSIS — M25511 Pain in right shoulder: Secondary | ICD-10-CM

## 2017-05-11 DIAGNOSIS — Z791 Long term (current) use of non-steroidal anti-inflammatories (NSAID): Secondary | ICD-10-CM

## 2017-05-11 DIAGNOSIS — I10 Essential (primary) hypertension: Secondary | ICD-10-CM

## 2017-05-11 DIAGNOSIS — Z9114 Patient's other noncompliance with medication regimen: Secondary | ICD-10-CM

## 2017-05-11 DIAGNOSIS — K219 Gastro-esophageal reflux disease without esophagitis: Secondary | ICD-10-CM

## 2017-05-11 DIAGNOSIS — M25562 Pain in left knee: Secondary | ICD-10-CM

## 2017-05-11 DIAGNOSIS — M25641 Stiffness of right hand, not elsewhere classified: Secondary | ICD-10-CM | POA: Diagnosis not present

## 2017-05-11 DIAGNOSIS — M25532 Pain in left wrist: Secondary | ICD-10-CM

## 2017-05-11 DIAGNOSIS — Z79899 Other long term (current) drug therapy: Secondary | ICD-10-CM

## 2017-05-11 DIAGNOSIS — N529 Male erectile dysfunction, unspecified: Secondary | ICD-10-CM

## 2017-05-11 MED ORDER — LISINOPRIL 20 MG PO TABS: 20.0000 mg | ORAL_TABLET | Freq: Every day | ORAL | 11 refills | Status: AC

## 2017-05-11 MED ORDER — PANTOPRAZOLE SODIUM 40 MG PO TBEC: 40.0000 mg | DELAYED_RELEASE_TABLET | Freq: Every day | ORAL | 1 refills | Status: AC

## 2017-05-11 MED ORDER — HYDROCHLOROTHIAZIDE 25 MG PO TABS: 25.0000 mg | ORAL_TABLET | Freq: Every day | ORAL | 1 refills | Status: DC

## 2017-05-11 NOTE — Assessment & Plan Note (Signed)
Patient endorses morning stiffness in the proximal joints of his hands, sometimes down into wrists. This pain is usually improved during the day. This pain is described differently from his shoulder pain, which he states usually worsens with activity and is relieved with use of PRN naproxen. Patient's physical exam with some possible tenosynovitis and tenderness with palpation of MCP joints. Will assess for evidence of joint inflammation/destruction as a result of RA with bilateral hand X ray. Hopefully this imaging study will be useful in distinguishing between RA and OA in his distal upper extremities.  Plan: -Bilateral X ray of hands (ballcatcher's view)

## 2017-05-11 NOTE — Assessment & Plan Note (Signed)
BP elevated >160/90 on multiple readings today. Recent BMP with possible worsening kidney function relative to baseline. Patient not tolerating amlodipine and taking lisinopril-HCTZ PRN.   Will start patient on lisinopril 20 mg daily for BP management. Told patient of importance of taking this medication daily so that we can make meaningful changes to his BP regimen at follow up. Patient expressed understanding of the plan.  Plan: -Discontinue amlodipine 5 mg 2/2 patient preference and side effects -START lisinopril 20 mg daily for long-term kidney protection in CKD patient

## 2017-05-11 NOTE — Assessment & Plan Note (Signed)
Patient states epigastric pain worse with naproxen use and not well controlled on omeprazole daily. Will switch to pantoprazole 40 mg daily with plans to uptitrate was needed at follow up.  Plan: -Pantoprazole 40 mg daily, consider BID dosing if symptoms not well controlled at follow up

## 2017-05-11 NOTE — Progress Notes (Signed)
   CC: HTN follow up  HPI:  Thomas Bailey is a 62 y.o. with PMH of HTN who presents for management of chronic medical conditions. Patient last seen on 03/16/17, at which time BP elevated and he was told to start low dose amlodipine. History obtained with use of intrepreter as patient's primary language is arabic. Since his last visit the patient self-discontinued amlodipine because it gave him a headache. Patient states that intermittently (2-3 days per week) he has been taking old BP medications (lisinopril-HCTZ 20-12.5 mg) to help with BP. He does not take this regularly and notes no side effects from this medication. Denies chest pain, lower extremity swelling, and shortness of breath.  Patient states that for the past few months he has experienced bilateral hand pain every morning. He states when he wakes up his are difficult to move, but they improve after about 30 minutes or so. He states that the pain lessens throughout the day but it never completely goes away. When asked about other joints, the patient intermittently notices this pain in his wrists and knees as well. He has previously talked about pain in his shoulders that occurs with movement. He states that this shoulder pain usually gets worse throughout the day as he lifts things and is relieved with use of PRN naproxen. This pain in his hands does not go away when he takes naproxen for shoulder pain.  Lastly patient states that he has epigastric pain which usually worsens with meals and is only partially relieved with daily omeprazole. He states that this epigastric pain is worse on days when he takes naproxen. Denies bloody and bilious emesis.   Past Medical History: Past Medical History:  Diagnosis Date  . BPH (benign prostatic hyperplasia) 08/08/2015  . Erectile dysfunction 08/08/2015  . GERD (gastroesophageal reflux disease)   . Hypertension    Review of Systems:   Patient endorses bilateral pain in hands,  inability to move them in the morning, as per HPI Patient denies chest pain, shortness of breath, abdominal pain, diaphoresis, nausea/vomiting, lower extremity swelling, and change in bowel/bladder habits.  Physical Exam:  Vitals:   05/11/17 1600  BP: (!) 164/94  Pulse: 83  Temp: 98.2 F (36.8 C)  TempSrc: Oral  SpO2: 100%  Weight: 144 lb 3.2 oz (65.4 kg)  Height: 5\' 6"  (1.676 m)   Physical Exam  Constitutional: He appears well-developed and well-nourished. No distress.  Cardiovascular: Normal rate, regular rhythm and intact distal pulses. Exam reveals no friction rub.  No murmur heard. Respiratory: Effort normal. No respiratory distress. He has no wheezes. He has no rales.  GI: Soft. He exhibits no distension. There is no tenderness. There is no rebound.  Musculoskeletal: He exhibits no edema (of bilateral lower extremities) or tenderness (of bilateral lower extremities).  Skin: Skin is warm and dry. No rash noted. He is not diaphoretic. No erythema.   Assessment & Plan:   See Encounters Tab for problem based charting.  Patient discussed with Dr. Eppie Gibson.

## 2017-05-11 NOTE — Assessment & Plan Note (Addendum)
Patient states he has follow up rescheduled with [facility] on 05/18/2017. Am hopeful patient will be good candidate for implantable device.

## 2017-05-11 NOTE — Patient Instructions (Addendum)
Thank you for seeing [place] in the clinic today!  You were evaluated for high blood pressure. Please take lisinopril 20 mg daily. Please stop taking amlodipine 5 mg as this gives you a headache.  For your hand pain, please go to the department of radiology for X rays. This department is on the first floor of [facility], near the emergency room. Please ask for directions to this department if you have trouble finding it. I have placed the order for this test and they will take the pictures of your hands at your earliest convenience. I will call you with the results of this test.  Please return to the clinic in 4 weeks for follow up of your high blood pressure.  If you have any questions or concerns, please call our clinic at [phone] between the hours of 9am-5pm. If you have a problem after these hours, please call [phone] and ask for the internal medicine resident on call. If you feel you are having a medical emergency please call 911.   Thanks, Dr. Larena Glassman [place] Nilson

## 2017-05-11 NOTE — Assessment & Plan Note (Signed)
Patient's last BMP with Cr = 1.59, eGFR <60. CKD likely 2/2 chronic hypertension. Lisinopril started for long term renal protection today.  Plan: -BMP in 6 months after long term BP control

## 2017-05-14 ENCOUNTER — Ambulatory Visit (HOSPITAL_COMMUNITY)
Admission: RE | Admit: 2017-05-14 | Discharge: 2017-05-14 | Disposition: A | Discharge status: Home / Self Care | Payer: BLUE CROSS/BLUE SHIELD | Source: Ambulatory Visit | Attending: Internal Medicine | Admitting: Internal Medicine

## 2017-05-14 DIAGNOSIS — M25642 Stiffness of left hand, not elsewhere classified: Secondary | ICD-10-CM | POA: Diagnosis present

## 2017-05-14 DIAGNOSIS — M25641 Stiffness of right hand, not elsewhere classified: Secondary | ICD-10-CM

## 2017-05-14 DIAGNOSIS — M24841 Other specific joint derangements of right hand, not elsewhere classified: Secondary | ICD-10-CM | POA: Diagnosis not present

## 2017-05-14 DIAGNOSIS — M24842 Other specific joint derangements of left hand, not elsewhere classified: Secondary | ICD-10-CM | POA: Insufficient documentation

## 2017-05-14 NOTE — Progress Notes (Signed)
Case discussed with Dr. Berneice Gandy at the time of the visit.  We reviewed the resident's history and exam and pertinent patient test results.  I agree with the assessment, diagnosis and plan of care documented in the resident's note.  Patient apparently did not get his hand X-rays as requested.

## 2017-06-15 ENCOUNTER — Ambulatory Visit: Payer: BLUE CROSS/BLUE SHIELD

## 2017-06-15 ENCOUNTER — Encounter: Payer: Self-pay | Admitting: Internal Medicine

## 2017-08-19 ENCOUNTER — Encounter: Payer: Self-pay | Admitting: *Deleted

## 2018-02-15 ENCOUNTER — Encounter: Payer: BLUE CROSS/BLUE SHIELD | Admitting: Internal Medicine

## 2018-02-15 NOTE — Progress Notes (Deleted)
   CC: ***  HPI:  Mr.Kaysan Suliman Nivaan Dicenzo is a 62 y.o.  with a PMH listed below presenting for ***   Assessment: Blood pressure today is***.  He is currently on lisinopril 20 mg daily he has a history of CKD and was switched from amlodipine to lisinopril due to him not being able to tolerate the medication.  Patient has a history of GERD and is currently on pantoprazole 40 mg daily.  Today he reports***.  He can consider increasing pantoprazole to twice daily.  Patient has a history of CKD stage***.  He was started on lisinopril on his last visit.  Will repeat BMP today.  Please see A&P for status of the patient's chronic medical conditions  Past Medical History:  Diagnosis Date  . BPH (benign prostatic hyperplasia) 08/08/2015  . Erectile dysfunction 08/08/2015  . GERD (gastroesophageal reflux disease)   . Hypertension    Review of Systems: Refer to history of present illness and assessment and plans for pertinent review of systems, all others reviewed and negative.  Physical Exam:  There were no vitals filed for this visit. *** Physical Exam  Social History   Socioeconomic History  . Marital status: Married    Spouse name: Not on file  . Number of children: Not on file  . Years of education: Not on file  . Highest education level: Not on file  Occupational History  . Not on file  Social Needs  . Financial resource strain: Not on file  . Food insecurity:    Worry: Not on file    Inability: Not on file  . Transportation needs:    Medical: Not on file    Non-medical: Not on file  Tobacco Use  . Smoking status: Former Smoker    Packs/day: 0.20    Years: 15.00    Pack years: 3.00    Types: Cigarettes  Substance and Sexual Activity  . Alcohol use: No    Alcohol/week: 0.0 standard drinks  . Drug use: No  . Sexual activity: Yes    Partners: Female  Lifestyle  . Physical activity:    Days per week: Not on file    Minutes per session: Not on file  .  Stress: Not on file  Relationships  . Social connections:    Talks on phone: Not on file    Gets together: Not on file    Attends religious service: Not on file    Active member of club or organization: Not on file    Attends meetings of clubs or organizations: Not on file    Relationship status: Not on file  . Intimate partner violence:    Fear of current or ex partner: Not on file    Emotionally abused: Not on file    Physically abused: Not on file    Forced sexual activity: Not on file  Other Topics Concern  . Not on file  Social History Narrative  . Not on file   *** Family History  Problem Relation Age of Onset  . Hypertension Brother   . Hypertension Father   . Hypertension Mother     Assessment & Plan:   See Encounters Tab for problem based charting.  Patient seen with Dr. Illa Level. Hoffman","Klima","Mullen","Narendra","Raines","Vincent"}

## 2019-01-31 NOTE — Progress Notes (Deleted)
   CC: ***  HPI:  [name] is a 63 y.o.  with a PMH listed below presenting for ***   Patient is currently taking lisinopril 20 mg daily at home. BP today is ***. Last BMP showed worsening kidney function, will repeat BMP today.    Please see A&P for status of the patient's chronic medical conditions  Past Medical History:  Diagnosis Date  . BPH (benign prostatic hyperplasia) 08/08/2015  . Erectile dysfunction 08/08/2015  . GERD (gastroesophageal reflux disease)   . Hypertension    Review of Systems: Refer to history of present illness and assessment and plans for pertinent review of systems, all others reviewed and negative.  Physical Exam:  There were no vitals filed for this visit. *** Physical Exam  Social History   Socioeconomic History  . Marital status: Married    Spouse name: Not on file  . Number of children: Not on file  . Years of education: Not on file  . Highest education level: Not on file  Occupational History  . Not on file  Social Needs  . Financial resource strain: Not on file  . Food insecurity    Worry: Not on file    Inability: Not on file  . Transportation needs    Medical: Not on file    Non-medical: Not on file  Tobacco Use  . Smoking status: Former Smoker    Packs/day: 0.20    Years: 15.00    Pack years: 3.00    Types: Cigarettes  Substance and Sexual Activity  . Alcohol use: No    Alcohol/week: 0.0 standard drinks  . Drug use: No  . Sexual activity: Yes    Partners: Female  Lifestyle  . Physical activity    Days per week: Not on file    Minutes per session: Not on file  . Stress: Not on file  Relationships  . Social Herbalist on phone: Not on file    Gets together: Not on file    Attends religious service: Not on file    Active member of club or organization: Not on file    Attends meetings of clubs or organizations: Not on file    Relationship status: Not on file  . Intimate partner  violence    Fear of current or ex partner: Not on file    Emotionally abused: Not on file    Physically abused: Not on file    Forced sexual activity: Not on file  Other Topics Concern  . Not on file  Social History Narrative  . Not on file   *** Family History  Problem Relation Age of Onset  . Hypertension Brother   . Hypertension Father   . Hypertension Mother     Assessment & Plan:   See Encounters Tab for problem based charting.  Patient {GC/GE:[id number]::"discussed with","seen with"} Dr. {NAMES:[id number]::"Butcher","Granfortuna","E. Hoffman","Klima","Mullen","Narendra","Raines","Vincent"}

## 2019-02-01 ENCOUNTER — Encounter: Payer: Self-pay | Admitting: Internal Medicine

## 2020-08-29 ENCOUNTER — Emergency Department (HOSPITAL_COMMUNITY): Payer: Self-pay

## 2020-08-29 ENCOUNTER — Encounter (HOSPITAL_COMMUNITY): Payer: Self-pay

## 2020-08-29 ENCOUNTER — Emergency Department (HOSPITAL_COMMUNITY)
Admission: EM | Admit: 2020-08-29 | Discharge: 2020-08-30 | Disposition: A | Discharge status: Home / Self Care | Payer: Self-pay | Attending: Emergency Medicine | Admitting: Emergency Medicine

## 2020-08-29 ENCOUNTER — Other Ambulatory Visit: Payer: Self-pay

## 2020-08-29 DIAGNOSIS — G8929 Other chronic pain: Secondary | ICD-10-CM | POA: Insufficient documentation

## 2020-08-29 DIAGNOSIS — N183 Chronic kidney disease, stage 3 unspecified: Secondary | ICD-10-CM | POA: Insufficient documentation

## 2020-08-29 DIAGNOSIS — M79605 Pain in left leg: Secondary | ICD-10-CM | POA: Insufficient documentation

## 2020-08-29 DIAGNOSIS — Z7982 Long term (current) use of aspirin: Secondary | ICD-10-CM | POA: Insufficient documentation

## 2020-08-29 DIAGNOSIS — I129 Hypertensive chronic kidney disease with stage 1 through stage 4 chronic kidney disease, or unspecified chronic kidney disease: Secondary | ICD-10-CM | POA: Insufficient documentation

## 2020-08-29 DIAGNOSIS — S82841A Displaced bimalleolar fracture of right lower leg, initial encounter for closed fracture: Secondary | ICD-10-CM | POA: Insufficient documentation

## 2020-08-29 DIAGNOSIS — Z20822 Contact with and (suspected) exposure to covid-19: Secondary | ICD-10-CM | POA: Insufficient documentation

## 2020-08-29 DIAGNOSIS — X58XXXA Exposure to other specified factors, initial encounter: Secondary | ICD-10-CM | POA: Insufficient documentation

## 2020-08-29 DIAGNOSIS — R42 Dizziness and giddiness: Secondary | ICD-10-CM | POA: Insufficient documentation

## 2020-08-29 DIAGNOSIS — R000 Tachycardia, unspecified: Secondary | ICD-10-CM | POA: Insufficient documentation

## 2020-08-29 DIAGNOSIS — Z87891 Personal history of nicotine dependence: Secondary | ICD-10-CM | POA: Insufficient documentation

## 2020-08-29 DIAGNOSIS — R0602 Shortness of breath: Secondary | ICD-10-CM | POA: Insufficient documentation

## 2020-08-29 LAB — COMPREHENSIVE METABOLIC PANEL WITH GFR
ALT: 11 U/L (ref 0–44)
AST: 15 U/L (ref 15–41)
Albumin: 4 g/dL (ref 3.5–5.0)
Alkaline Phosphatase: 164 U/L — ABNORMAL HIGH (ref 38–126)
Anion gap: 12 (ref 5–15)
BUN: 13 mg/dL (ref 8–23)
CO2: 19 mmol/L — ABNORMAL LOW (ref 22–32)
Calcium: 9.7 mg/dL (ref 8.9–10.3)
Chloride: 107 mmol/L (ref 98–111)
Creatinine, Ser: 1.28 mg/dL — ABNORMAL HIGH (ref 0.61–1.24)
GFR, Estimated: >60 mL/min
Glucose, Bld: 98 mg/dL (ref 70–99)
Potassium: 3.5 mmol/L (ref 3.5–5.1)
Sodium: 138 mmol/L (ref 135–145)
Total Bilirubin: 0.5 mg/dL (ref 0.3–1.2)
Total Protein: 8.2 g/dL — ABNORMAL HIGH (ref 6.5–8.1)

## 2020-08-29 LAB — CBC WITH DIFFERENTIAL/PLATELET
Abs Immature Granulocytes: 0.05 K/uL (ref 0.00–0.07)
Basophils Absolute: 0.1 K/uL (ref 0.0–0.1)
Basophils Relative: 1 %
Eosinophils Absolute: 0.2 K/uL (ref 0.0–0.5)
Eosinophils Relative: 3 %
HCT: 44.6 % (ref 39.0–52.0)
Hemoglobin: 13.7 g/dL (ref 13.0–17.0)
Immature Granulocytes: 1 %
Lymphocytes Relative: 20 %
Lymphs Abs: 1.7 K/uL (ref 0.7–4.0)
MCH: 26 pg (ref 26.0–34.0)
MCHC: 30.7 g/dL (ref 30.0–36.0)
MCV: 84.8 fL (ref 80.0–100.0)
Monocytes Absolute: 0.5 K/uL (ref 0.1–1.0)
Monocytes Relative: 6 %
Neutro Abs: 6 K/uL (ref 1.7–7.7)
Neutrophils Relative %: 69 %
Platelets: 417 K/uL — ABNORMAL HIGH (ref 150–400)
RBC: 5.26 MIL/uL (ref 4.22–5.81)
RDW: 15.1 % (ref 11.5–15.5)
WBC: 8.6 K/uL (ref 4.0–10.5)
nRBC: 0 % (ref 0.0–0.2)

## 2020-08-29 LAB — RESP PANEL BY RT-PCR (FLU A&B, COVID) ARPGX2
Influenza A by PCR: NEGATIVE
Influenza B by PCR: NEGATIVE
SARS Coronavirus 2 by RT PCR: NEGATIVE

## 2020-08-29 MED ORDER — OXYCODONE-ACETAMINOPHEN 5-325 MG PO TABS
1.0000 | ORAL_TABLET | Freq: Once | ORAL | Status: AC
  Administered 2020-08-29: 1 via ORAL
  Filled 2020-08-29: qty 1
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
## 2020-08-30 ENCOUNTER — Encounter (HOSPITAL_COMMUNITY): Payer: Self-pay

## 2020-08-30 ENCOUNTER — Emergency Department (HOSPITAL_COMMUNITY): Payer: Self-pay

## 2020-08-30 LAB — URINALYSIS, ROUTINE W REFLEX MICROSCOPIC
Bacteria, UA: NONE SEEN
Bilirubin Urine: NEGATIVE
Glucose, UA: NEGATIVE mg/dL
Ketones, ur: NEGATIVE mg/dL
Leukocytes,Ua: NEGATIVE
Nitrite: NEGATIVE
Protein, ur: 30 mg/dL — AB
Specific Gravity, Urine: 1.026 (ref 1.005–1.030)
pH: 5 (ref 5.0–8.0)

## 2020-08-30 MED ORDER — IOHEXOL 350 MG/ML SOLN
100.0000 mL | Freq: Once | INTRAVENOUS | Status: AC | PRN
  Administered 2020-08-30: 100 mL via INTRAVENOUS

## 2020-08-30 MED ORDER — OXYCODONE-ACETAMINOPHEN 5-325 MG PO TABS: 1.0000 | ORAL_TABLET | Freq: Four times a day (QID) | ORAL | 0 refills | Status: AC | PRN

## 2020-08-30 NOTE — ED Provider Notes (Signed)
Ultrasound ED Peripheral IV (Provider)  Date/Time: 08/30/2020 12:32 AM Performed by: Maudie Flakes, MD Authorized by: Maudie Flakes, MD   Procedure details:    Indications: multiple failed IV attempts     Skin Prep: chlorhexidine gluconate     Location:  Right AC   Angiocath:  20 G   Bedside Ultrasound Guided: Yes     Patient tolerated procedure without complications: Yes     Dressing applied: Yes      Maudie Flakes, MD 08/30/20 [phone]

## 2020-08-30 NOTE — Discharge Instructions (Addendum)
Your imaging today showed a fracture to your right ankle.  It is unclear whether this is an old or new injury.  You were placed in a splint as a precaution.  Use crutches to prevent from putting weight on your right foot/leg.  Take Percocet as prescribed for management of pain.  Do not drive or drink alcohol after taking this medication as it may make you drowsy and impair your judgment.  We advise close follow-up with orthopedics.  You have been provided a referral to Dr. Lyla Glassing of [facility].  Call in the morning to schedule a follow-up visit.  The remainder of your evaluation in the emergency department today has been reassuring.  You may return for new or concerning symptoms.

## 2020-08-30 NOTE — ED Notes (Signed)
Pt verbalized understanding of d/c, medication, and follow up care. Crutches currently at bedside while awaiting ortho tech to apply splint

## 2020-08-30 NOTE — Progress Notes (Signed)
Orthopedic Tech Progress Note Patient Details:  Thomas Bailey 26-Jul-1955 [id number]  Ortho Devices Type of Ortho Device: Stirrup splint, Post (short leg) splint Ortho Device/Splint Location: RLE Ortho Device/Splint Interventions: Ordered, Application, Adjustment   Post Interventions Patient Tolerated: Well Instructions Provided: Care of device, Poper ambulation with device  Thomas Bailey 08/30/2020, 7:00 AM

## 2020-08-30 NOTE — ED Notes (Signed)
Called ortho tech to apply splint

## 2020-10-01 ENCOUNTER — Ambulatory Visit: Payer: Self-pay

## 2020-10-01 ENCOUNTER — Ambulatory Visit (INDEPENDENT_AMBULATORY_CARE_PROVIDER_SITE_OTHER): Payer: Self-pay

## 2020-10-01 ENCOUNTER — Ambulatory Visit: Payer: Self-pay | Admitting: Orthopedic Surgery

## 2020-10-01 ENCOUNTER — Encounter: Payer: Self-pay | Admitting: Orthopedic Surgery

## 2020-10-01 ENCOUNTER — Ambulatory Visit (INDEPENDENT_AMBULATORY_CARE_PROVIDER_SITE_OTHER): Payer: Self-pay | Admitting: Orthopedic Surgery

## 2020-10-01 DIAGNOSIS — M25572 Pain in left ankle and joints of left foot: Secondary | ICD-10-CM

## 2020-10-01 DIAGNOSIS — S82262A Displaced segmental fracture of shaft of left tibia, initial encounter for closed fracture: Secondary | ICD-10-CM

## 2020-10-01 DIAGNOSIS — M25562 Pain in left knee: Secondary | ICD-10-CM

## 2020-10-01 DIAGNOSIS — M25561 Pain in right knee: Secondary | ICD-10-CM

## 2020-10-01 DIAGNOSIS — G8929 Other chronic pain: Secondary | ICD-10-CM

## 2020-10-01 DIAGNOSIS — M25571 Pain in right ankle and joints of right foot: Secondary | ICD-10-CM

## 2020-10-01 DIAGNOSIS — S82841A Displaced bimalleolar fracture of right lower leg, initial encounter for closed fracture: Secondary | ICD-10-CM
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
## 2020-11-01 ENCOUNTER — Encounter: Payer: Self-pay | Admitting: Orthopedic Surgery

## 2020-11-01 ENCOUNTER — Ambulatory Visit (INDEPENDENT_AMBULATORY_CARE_PROVIDER_SITE_OTHER): Payer: Self-pay

## 2020-11-01 ENCOUNTER — Ambulatory Visit (INDEPENDENT_AMBULATORY_CARE_PROVIDER_SITE_OTHER): Payer: Self-pay | Admitting: Orthopedic Surgery

## 2020-11-01 DIAGNOSIS — M25571 Pain in right ankle and joints of right foot: Secondary | ICD-10-CM

## 2020-11-01 DIAGNOSIS — G8929 Other chronic pain: Secondary | ICD-10-CM

## 2020-11-01 DIAGNOSIS — M25572 Pain in left ankle and joints of left foot: Secondary | ICD-10-CM

## 2020-11-01 DIAGNOSIS — M25561 Pain in right knee: Secondary | ICD-10-CM

## 2020-11-01 DIAGNOSIS — M25562 Pain in left knee: Secondary | ICD-10-CM

## 2020-11-02 ENCOUNTER — Encounter: Payer: Self-pay | Admitting: Orthopedic Surgery
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
## 2021-11-11 ENCOUNTER — Ambulatory Visit
Admission: RE | Admit: 2021-11-11 | Discharge: 2021-11-11 | Disposition: A | Discharge status: Home / Self Care | Payer: BC Managed Care – PPO | Source: Ambulatory Visit | Attending: Cardiovascular Disease | Admitting: Cardiovascular Disease

## 2021-11-11 ENCOUNTER — Other Ambulatory Visit: Payer: Self-pay | Admitting: Cardiovascular Disease

## 2021-11-11 DIAGNOSIS — R42 Dizziness and giddiness: Secondary | ICD-10-CM

## 2021-11-11 DIAGNOSIS — R109 Unspecified abdominal pain: Secondary | ICD-10-CM

## 2021-11-11 DIAGNOSIS — R079 Chest pain, unspecified: Secondary | ICD-10-CM

## 2022-06-17 IMAGING — CT CT ANGIO CHEST
2 of 6 series · 18 of 36 positions shown · IV contrast (OMNIPAQUE 350)
Comparison: None.

CLINICAL DATA: Shortness of breath, fever, chills

EXAM:
CT ANGIOGRAPHY CHEST WITH CONTRAST
TECHNIQUE: Multidetector CT imaging of the chest was performed using the
standard protocol during bolus administration of intravenous
contrast. Multiplanar CT image reconstructions and MIPs were
obtained to evaluate the vascular anatomy.
CONTRAST:  100mL OMNIPAQUE IOHEXOL 350 MG/ML SOLN

[Series 6: thins · axial · 0.62mm/px · z∈[+1466,+1688]mm · 17 of 250 slices shown]
[im 14/250  lung]
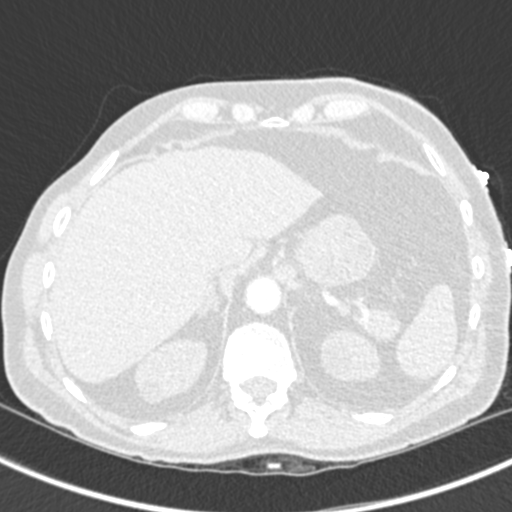
[im 28/250  mediastinal]
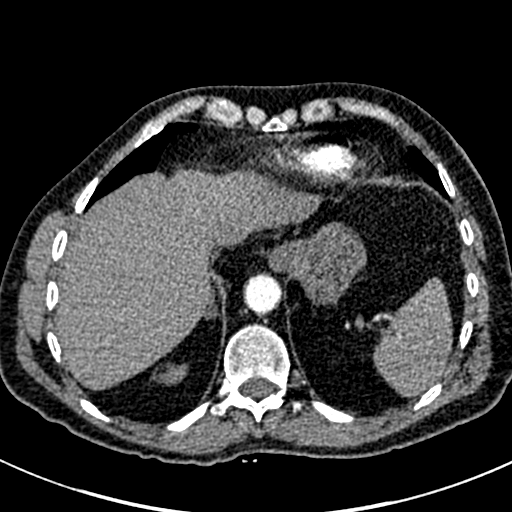
[im 42/250  lung]
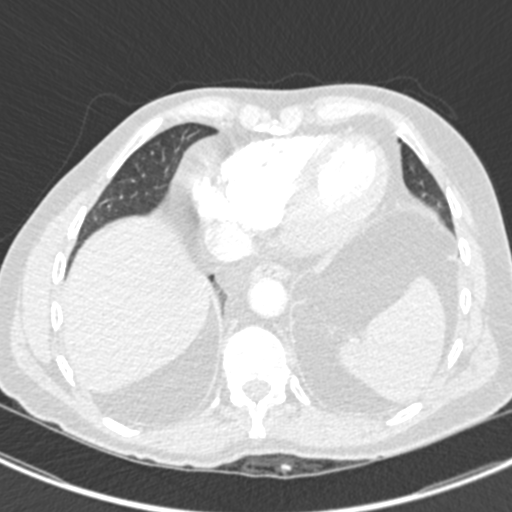
[im 56/250  mediastinal]
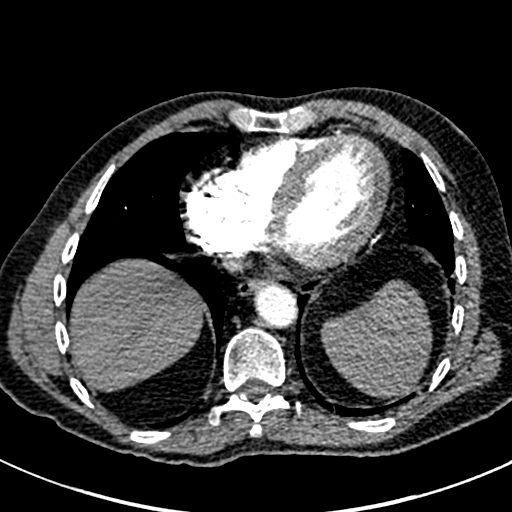
[im 70/250  lung]
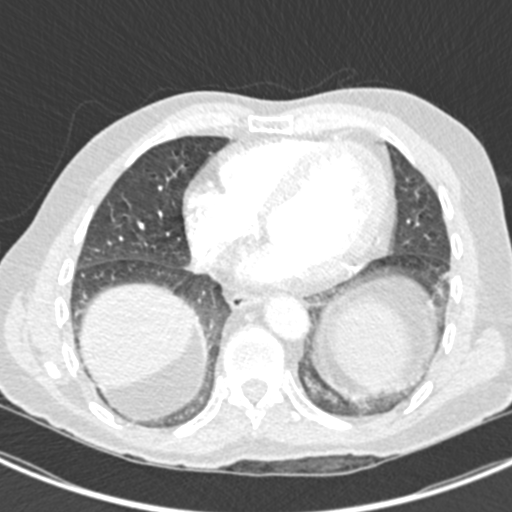
[im 84/250  mediastinal]
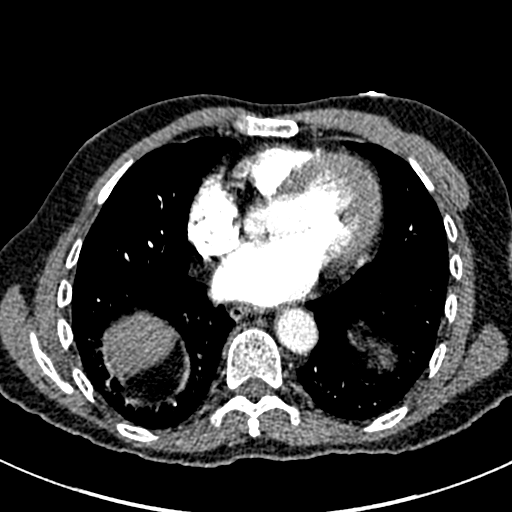
[im 97/250  lung]
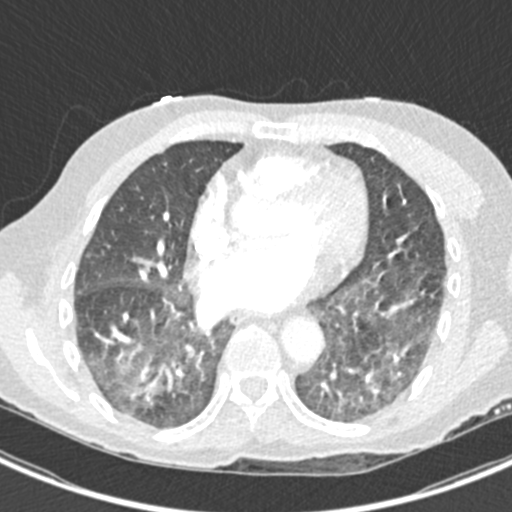
[im 111/250  mediastinal]
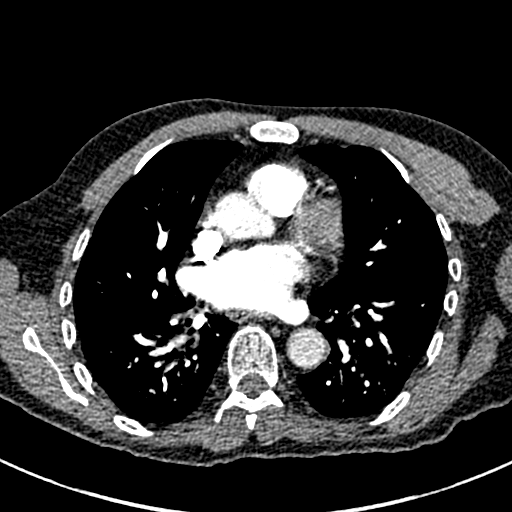
[im 125/250  lung]
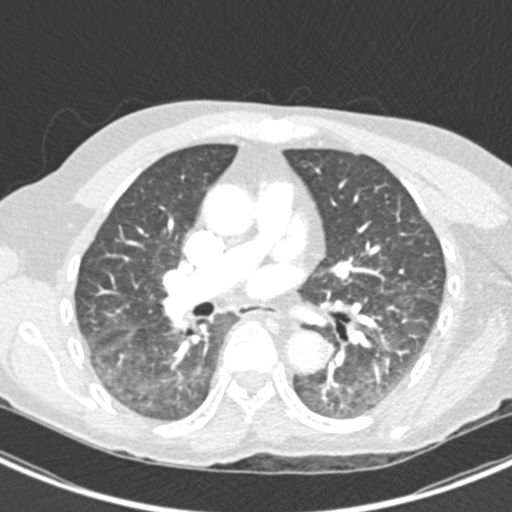
[im 139/250  mediastinal]
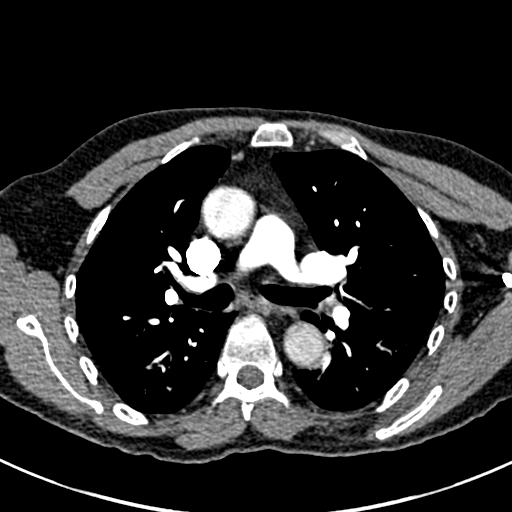
[im 153/250  lung]
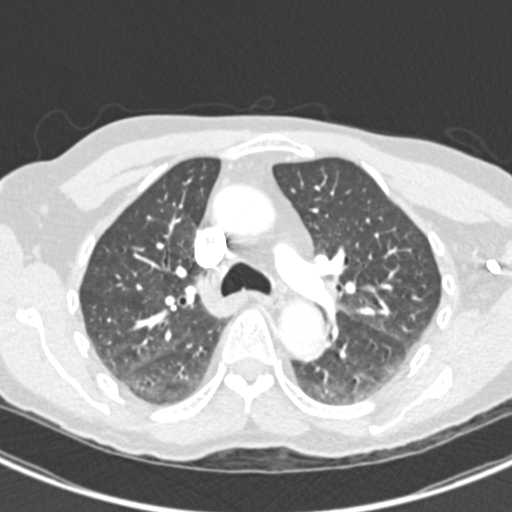
[im 167/250  mediastinal]
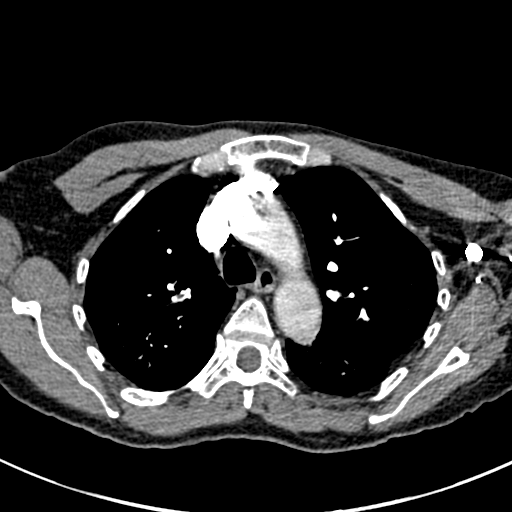
[im 180/250  lung]
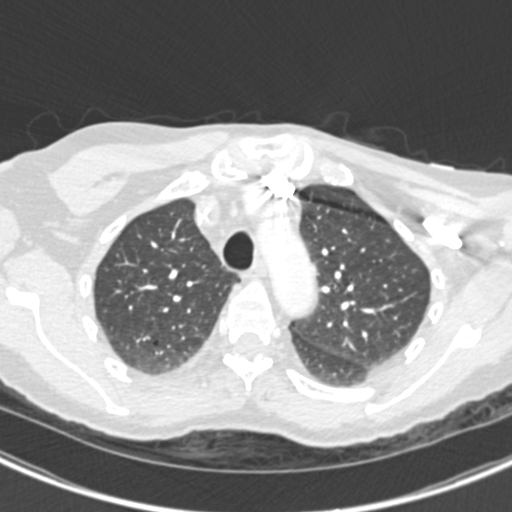
[im 194/250  mediastinal]
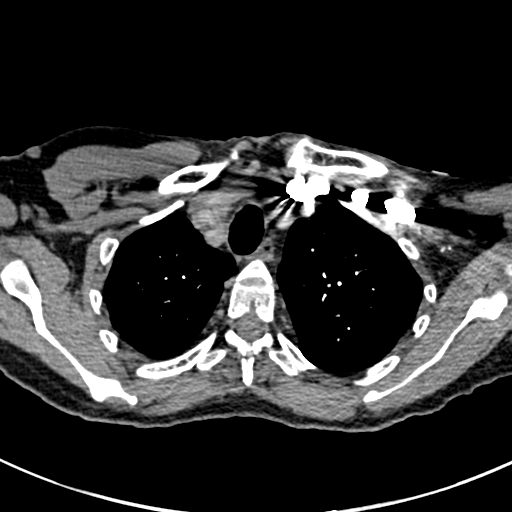
[im 208/250  lung]
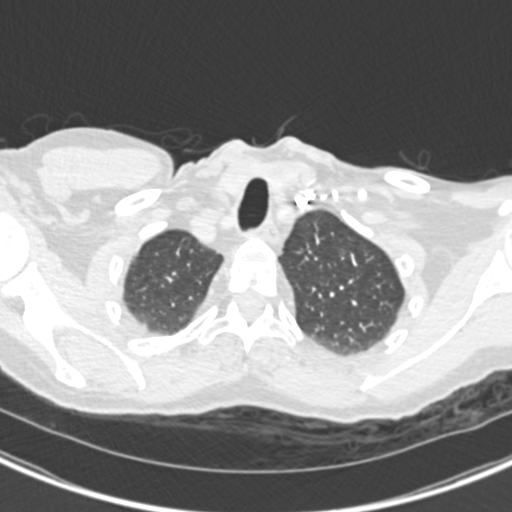
[im 222/250  mediastinal]
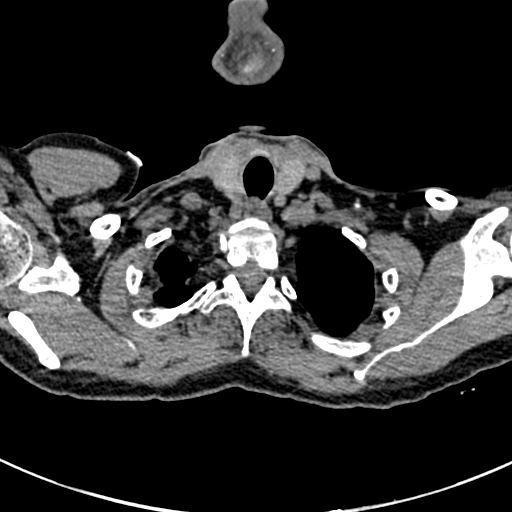
[im 236/250  lung]
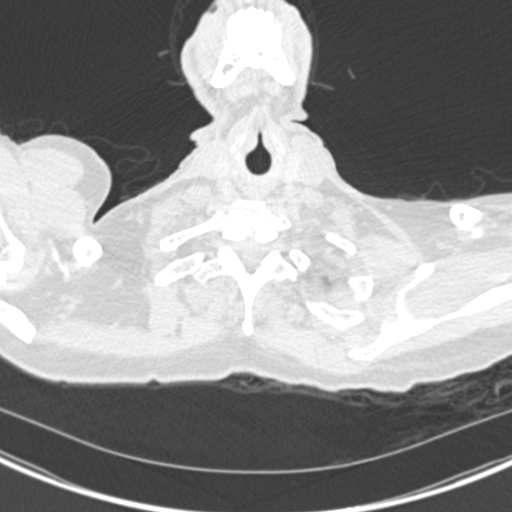

[Series 8: coronal mpr · coronal · 0.50mm/px · 1 of 113 slices shown]
[im 57/113  mediastinal]
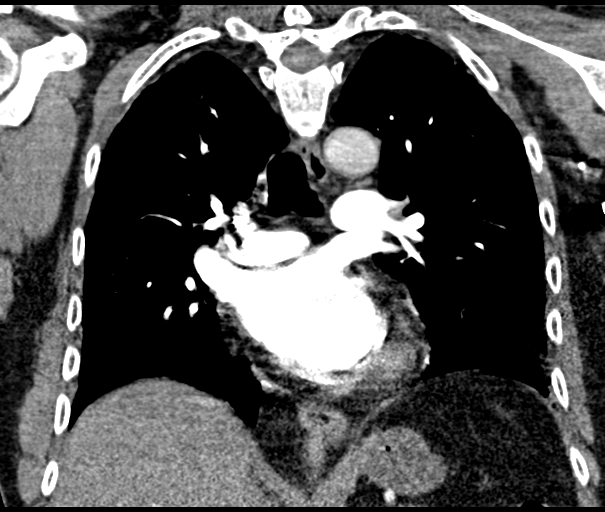

[18 of 36 positions shown; findings below may reference images not displayed]

FINDINGS: Cardiovascular: No filling defects in the pulmonary arteries to
suggest pulmonary emboli. Heart is normal size. Aorta is normal
caliber. Coronary artery calcifications.

Mediastinum/Nodes: No mediastinal, hilar, or axillary adenopathy.
Trachea and esophagus are unremarkable. Thyroid unremarkable.

Lungs/Pleura: No confluent opacities or effusions.

Upper Abdomen: Imaging into the upper abdomen demonstrates no acute
findings.

Musculoskeletal: Chest wall soft tissues are unremarkable. No acute
bony abnormality.

Review of the MIP images confirms the above findings.
IMPRESSION: No evidence of pulmonary embolus.

No acute cardiopulmonary disease.
